# Patient Record
Sex: Male | Born: 1950 | Hispanic: No | Marital: Married | Smoking: Never smoker
Health system: Southern US, Community
[De-identification: ages and names within clinical notes are randomized; demographics above are authoritative.]

## PROBLEM LIST (undated history)

## (undated) DIAGNOSIS — H5359 Other color vision deficiencies: Secondary | ICD-10-CM

## (undated) DIAGNOSIS — Z87898 Personal history of other specified conditions: Secondary | ICD-10-CM

## (undated) DIAGNOSIS — Z0184 Encounter for antibody response examination: Secondary | ICD-10-CM

## (undated) DIAGNOSIS — B019 Varicella without complication: Secondary | ICD-10-CM

## (undated) DIAGNOSIS — C61 Malignant neoplasm of prostate: Secondary | ICD-10-CM

## (undated) DIAGNOSIS — Z789 Other specified health status: Secondary | ICD-10-CM

## (undated) HISTORY — DX: Other color vision deficiencies: H53.59

## (undated) HISTORY — DX: Varicella without complication: B01.9

## (undated) HISTORY — DX: Encounter for antibody response examination: Z01.84

## (undated) HISTORY — DX: Other specified health status: Z78.9

## (undated) HISTORY — DX: Personal history of other specified conditions: Z87.898

## (undated) HISTORY — PX: PROSTATE BIOPSY: SHX241

---

## 2008-03-28 HISTORY — PX: INGUINAL HERNIA REPAIR: SUR1180

## 2012-06-16 ENCOUNTER — Emergency Department (HOSPITAL_COMMUNITY)
Admission: EM | Admit: 2012-06-16 | Discharge: 2012-06-16 | Disposition: A | Discharge status: Home / Self Care | Payer: BC Managed Care – PPO | Attending: Emergency Medicine | Admitting: Emergency Medicine

## 2012-06-16 ENCOUNTER — Emergency Department (HOSPITAL_COMMUNITY): Payer: BC Managed Care – PPO

## 2012-06-16 ENCOUNTER — Encounter (HOSPITAL_COMMUNITY): Payer: Self-pay | Admitting: Emergency Medicine

## 2012-06-16 DIAGNOSIS — Y9239 Other specified sports and athletic area as the place of occurrence of the external cause: Secondary | ICD-10-CM | POA: Insufficient documentation

## 2012-06-16 DIAGNOSIS — W010XXA Fall on same level from slipping, tripping and stumbling without subsequent striking against object, initial encounter: Secondary | ICD-10-CM | POA: Insufficient documentation

## 2012-06-16 DIAGNOSIS — S0180XA Unspecified open wound of other part of head, initial encounter: Secondary | ICD-10-CM | POA: Insufficient documentation

## 2012-06-16 DIAGNOSIS — IMO0002 Reserved for concepts with insufficient information to code with codable children: Secondary | ICD-10-CM | POA: Insufficient documentation

## 2012-06-16 DIAGNOSIS — W1809XA Striking against other object with subsequent fall, initial encounter: Secondary | ICD-10-CM | POA: Insufficient documentation

## 2012-06-16 DIAGNOSIS — Y9373 Activity, racquet and hand sports: Secondary | ICD-10-CM | POA: Insufficient documentation

## 2012-06-16 DIAGNOSIS — R404 Transient alteration of awareness: Secondary | ICD-10-CM | POA: Insufficient documentation

## 2012-06-16 DIAGNOSIS — W19XXXA Unspecified fall, initial encounter: Secondary | ICD-10-CM

## 2012-06-16 DIAGNOSIS — S0181XA Laceration without foreign body of other part of head, initial encounter: Secondary | ICD-10-CM

## 2012-06-16 DIAGNOSIS — Z79899 Other long term (current) drug therapy: Secondary | ICD-10-CM | POA: Insufficient documentation

## 2012-06-16 NOTE — ED Notes (Signed)
Pt states he was playing tennis when he fell and hit his chin on the pavement.  Pt state he lost consciousness after that for 30 seconds, and lost consciousness briefly later as well.  Went to [facility], and was told to come here.  Pt alert, oriented, no bleeding noted from chin.

## 2012-06-16 NOTE — ED Provider Notes (Signed)
History     CSN: [id number]  Arrival date & time 06/16/12  1030   First MD Initiated Contact with Patient 06/16/12 1032      Chief Complaint  Patient presents with  . Loss of Consciousness  . Fall  . Head Laceration    (Consider location/radiation/quality/duration/timing/severity/associated sxs/prior treatment) HPI This is a 62 year old male who was playing tennis when he tripped and fell landing first on his hands and striking his chin. His friends are with him and he did not immediately have a loss of consciousness. They helped him over to a bench and put a bandage on his chin. They began playing tennis again and then noticed that he was slumped backwards against the fence. They laid him down and he woke up and they began to call 911 and he asked them to stop. They transported him by private vehicle here to the emergency department. He states he currently has no pain. He ambulated without difficulty. He does note the abrasions on his bilateral palms and a laceration below his chin. It had no difficulty with speech, vision, walking. He is not on any anticoagulation History reviewed. No pertinent past medical history.  History reviewed. No pertinent past surgical history.  History reviewed. No pertinent family history.  History  Substance Use Topics  . Smoking status: Never Smoker   . Smokeless tobacco: Not on file  . Alcohol Use: Yes      Review of Systems  All other systems reviewed and are negative.    Allergies  Review of patient's allergies indicates no known allergies.  Home Medications   Current Outpatient Rx  Name  Route  Sig  Dispense  Refill  . Multiple Vitamin (MULTIVITAMIN WITH MINERALS) TABS   Oral   Take 1 tablet by mouth daily.           BP 126/80  Pulse 56  Temp(Src) 97.4 F (36.3 C) (Oral)  Resp 16  SpO2 95%  Physical Exam  Nursing note and vitals reviewed. Constitutional: He is oriented to person, place, and time. He appears  well-developed and well-nourished.  HENT:  Head: Normocephalic.  Laceration under chin  Eyes: Conjunctivae and EOM are normal. Pupils are equal, round, and reactive to light.  Neck: Normal range of motion. Neck supple.  Cardiovascular: Normal rate, regular rhythm, normal heart sounds and intact distal pulses.   Pulmonary/Chest: Effort normal and breath sounds normal.  Abdominal: Soft.  Musculoskeletal: Normal range of motion.  No tenderness palpation of her bilateral hands or fingers or wrists  Neurological: He is alert and oriented to person, place, and time. He has normal strength and normal reflexes. Coordination and gait normal. GCS eye subscore is 4. GCS verbal subscore is 5. GCS motor subscore is 6.  Skin: Skin is warm.  Abrasion right hand abrasion left hand  Psychiatric: He has a normal mood and affect. His behavior is normal. Judgment and thought content normal.    ED Course  Procedures (including critical care time)  Labs Reviewed - No data to display No results found.   No diagnosis found.   Date: 06/16/2012  Rate: 53  Rhythm: normal sinus rhythm  QRS Axis: normal  Intervals: normal  ST/T Wave abnormalities: normal  Conduction Disutrbances:none  Narrative Interpretation:   Old EKG Reviewed: none available   LACERATION REPAIR Performed by: Hilario Quarry Authorized by: Hilario Quarry Consent: Verbal consent obtained. Risks and benefits: risks, benefits and alternatives were discussed Consent given by: patient Patient identity confirmed: provided  demographic data Prepped and Draped in normal sterile fashion Wound explored  Laceration Location: chin  Laceration Length: 2.5cm  No Foreign Bodies seen or palpated  Anesthesia: local infiltration  Local anesthetic: lidocaine 1% with epinephrine  Anesthetic total: 2 ml  Irrigation method: syringe Amount of cleaning: standard  Skin closure: 3 #5 prolene   Number of sutures: 3  Technique: simple  interrupted  Patient tolerance: Patient tolerated the procedure well with no immediate complications.  MDM  Patient to have CT scan performed 2 to age greater than 60 with head injury and loss of consciousness.    Patient continues awake and alert.  I have discussed with him indications for return and wound care.  He voices understanding.   Hilario Quarry, MD 06/17/12 [phone]

## 2013-07-10 ENCOUNTER — Ambulatory Visit: Payer: BC Managed Care – PPO | Admitting: Internal Medicine

## 2013-08-21 ENCOUNTER — Encounter: Payer: Self-pay | Admitting: Internal Medicine

## 2013-08-21 ENCOUNTER — Ambulatory Visit (INDEPENDENT_AMBULATORY_CARE_PROVIDER_SITE_OTHER): Payer: BC Managed Care – PPO | Admitting: Internal Medicine

## 2013-08-21 VITALS — BP 116/72 | Temp 97.4°F | Ht 65.0 in | Wt 144.0 lb

## 2013-08-21 DIAGNOSIS — L989 Disorder of the skin and subcutaneous tissue, unspecified: Secondary | ICD-10-CM

## 2013-08-21 DIAGNOSIS — Z7189 Other specified counseling: Secondary | ICD-10-CM

## 2013-08-21 NOTE — Patient Instructions (Addendum)
Get copy of immunizations  From work. For [place] and yourself. Will arrange a referral for  Dermatology to look at the skin area on your back. Continue lifestyle intervention healthy eating and exercise . 150 minutes of exercise weeks  , weight  athealthy levels. Avoid trans fats and processed foods;  Increase fresh fruits and veges to 5 servings per day. And avoid sweet beverages  Including tea and juice.  Please arrange for copies of your future evaluations and labs  At work to be sent to [place] for our records also . I will review and let you know if any other advice .    Preventive Care for Adults, Male A healthy lifestyle and preventive care can promote health and wellness. Preventive health guidelines for men include the following key practices:  A routine yearly physical is a good way to check with your health care provider about your health and preventative screening. It is a chance to share any concerns and updates on your health and to receive a thorough exam.  Visit your dentist for a routine exam and preventative care every 6 months. Brush your teeth twice a day and floss once a day. Good oral hygiene prevents tooth decay and gum disease.  The frequency of eye exams is based on your age, health, family medical history, use of contact lenses, and other factors. Follow your health care provider's recommendations for frequency of eye exams.  Eat a healthy diet. Foods such as vegetables, fruits, whole grains, low-fat dairy products, and lean protein foods contain the nutrients you need without too many calories. Decrease your intake of foods high in solid fats, added sugars, and salt. Eat the right amount of calories for you.Get information about a proper diet from your health care provider, if necessary.  Regular physical exercise is one of the most important things you can do for your health. Most adults should get at least 150 minutes of moderate-intensity exercise (any activity that  increases your heart rate and causes you to sweat) each week. In addition, most adults need muscle-strengthening exercises on 2 or more days a week.  Maintain a healthy weight. The body mass index (BMI) is a screening tool to identify possible weight problems. It provides an estimate of body fat based on height and weight. Your health care provider can find your BMI and can help you achieve or maintain a healthy weight.For adults 20 years and older:  A BMI below 18.5 is considered underweight.  A BMI of 18.5 to 24.9 is normal.  A BMI of 25 to 29.9 is considered overweight.  A BMI of 30 and above is considered obese.  Maintain normal blood lipids and cholesterol levels by exercising and minimizing your intake of saturated fat. Eat a balanced diet with plenty of fruit and vegetables. Blood tests for lipids and cholesterol should begin at age 76 and be repeated every 5 years. If your lipid or cholesterol levels are high, you are over 50, or you are at high risk for heart disease, you may need your cholesterol levels checked more frequently.Ongoing high lipid and cholesterol levels should be treated with medicines if diet and exercise are not working.  If you smoke, find out from your health care provider how to quit. If you do not use tobacco, do not start.  Lung cancer screening is recommended for adults aged 34 80 years who are at high risk for developing lung cancer because of a history of smoking. A yearly low-dose CT scan  of the lungs is recommended for people who have at least a 30-pack-year history of smoking and are a current smoker or have quit within the past 15 years. A pack year of smoking is smoking an average of 1 pack of cigarettes a day for 1 year (for example: 1 pack a day for 30 years or 2 packs a day for 15 years). Yearly screening should continue until the smoker has stopped smoking for at least 15 years. Yearly screening should be stopped for people who develop a health problem  that would prevent them from having lung cancer treatment.  If you choose to drink alcohol, do not have more than 2 drinks per day. One drink is considered to be 12 ounces (355 mL) of beer, 5 ounces (148 mL) of wine, or 1.5 ounces (44 mL) of liquor.  Avoid use of street drugs. Do not share needles with anyone. Ask for help if you need support or instructions about stopping the use of drugs.  High blood pressure causes heart disease and increases the risk of stroke. Your blood pressure should be checked at least every 1 2 years. Ongoing high blood pressure should be treated with medicines, if weight loss and exercise are not effective.  If you are 80 64 years old, ask your health care provider if you should take aspirin to prevent heart disease.  Diabetes screening involves taking a blood sample to check your fasting blood sugar level. This should be done once every 3 years, after age 48, if you are within normal weight and without risk factors for diabetes. Testing should be considered at a younger age or be carried out more frequently if you are overweight and have at least 1 risk factor for diabetes.  Colorectal cancer can be detected and often prevented. Most routine colorectal cancer screening begins at the age of 40 and continues through age 93. However, your health care provider may recommend screening at an earlier age if you have risk factors for colon cancer. On a yearly basis, your health care provider may provide home test kits to check for hidden blood in the stool. Use of a small camera at the end of a tube to directly examine the colon (sigmoidoscopy or colonoscopy) can detect the earliest forms of colorectal cancer. Talk to your health care provider about this at age 4, when routine screening begins. Direct exam of the colon should be repeated every 5 10 years through age 69, unless early forms of precancerous polyps or small growths are found.  People who are at an increased risk for  hepatitis B should be screened for this virus. You are considered at high risk for hepatitis B if:  You were born in a country where hepatitis B occurs often. Talk with your health care provider about which countries are considered high-risk.  Your parents were born in a high-risk country and you have not received a shot to protect against hepatitis B (hepatitis B vaccine).  You have HIV or AIDS.  You use needles to inject street drugs.  You live with, or have sex with, someone who has hepatitis B.  You are a man who has sex with other men (MSM).  You get hemodialysis treatment.  You take certain medicines for conditions such as cancer, organ transplantation, and autoimmune conditions.  Hepatitis C blood testing is recommended for all people born from [birth year] through 1965 and any individual with known risks for hepatitis C.  Practice safe sex. Use condoms and avoid  high-risk sexual practices to reduce the spread of sexually transmitted infections (STIs). STIs include gonorrhea, chlamydia, syphilis, trichomonas, herpes, HPV, and human immunodeficiency virus (HIV). Herpes, HIV, and HPV are viral illnesses that have no cure. They can result in disability, cancer, and death.  A one-time screening for abdominal aortic aneurysm (AAA) and surgical repair of large AAAs by ultrasound are recommended for men ages 74 to 32 years who are current or former smokers.  Healthy men should no longer receive prostate-specific antigen (PSA) blood tests as part of routine cancer screening. Talk with your health care provider about prostate cancer screening.  Testicular cancer screening is not recommended for adult males who have no symptoms. Screening includes self-exam, a health care provider exam, and other screening tests. Consult with your health care provider about any symptoms you have or any concerns you have about testicular cancer.  Use sunscreen. Apply sunscreen liberally and repeatedly throughout the  day. You should seek shade when your shadow is shorter than you. Protect yourself by wearing long sleeves, pants, a wide-brimmed hat, and sunglasses year round, whenever you are outdoors.  Once a month, do a whole-body skin exam, using a mirror to look at the skin on your back. Tell your health care provider about new moles, moles that have irregular borders, moles that are larger than a pencil eraser, or moles that have changed in shape or color.  Stay current with required vaccines (immunizations).  Influenza vaccine. All adults should be immunized every year.  Tetanus, diphtheria, and acellular pertussis (Td, Tdap) vaccine. An adult who has not previously received Tdap or who does not know his vaccine status should receive 1 dose of Tdap. This initial dose should be followed by tetanus and diphtheria toxoids (Td) booster doses every 10 years. Adults with an unknown or incomplete history of completing a 3-dose immunization series with Td-containing vaccines should begin or complete a primary immunization series including a Tdap dose. Adults should receive a Td booster every 10 years.  Varicella vaccine. An adult without evidence of immunity to varicella should receive 2 doses or a second dose if he has previously received 1 dose.  Human papillomavirus (HPV) vaccine. Males aged 42 21 years who have not received the vaccine previously should receive the 3-dose series. Males aged 30 26 years may be immunized. Immunization is recommended through the age of 37 years for any male who has sex with males and did not get any or all doses earlier. Immunization is recommended for any person with an immunocompromised condition through the age of 74 years if he did not get any or all doses earlier. During the 3-dose series, the second dose should be obtained 4 8 weeks after the first dose. The third dose should be obtained 24 weeks after the first dose and 16 weeks after the second dose.  Zoster vaccine. One dose  is recommended for adults aged 38 years or older unless certain conditions are present.  Measles, mumps, and rubella (MMR) vaccine. Adults born before [birth year] generally are considered immune to measles and mumps. Adults born in [birth year] or later should have 1 or more doses of MMR vaccine unless there is a contraindication to the vaccine or there is laboratory evidence of immunity to each of the three diseases. A routine second dose of MMR vaccine should be obtained at least 28 days after the first dose for students attending postsecondary schools, health care workers, or international travelers. People who received inactivated measles vaccine or an unknown type  of measles vaccine during 1963 1967 should receive 2 doses of MMR vaccine. People who received inactivated mumps vaccine or an unknown type of mumps vaccine before 1979 and are at high risk for mumps infection should consider immunization with 2 doses of MMR vaccine. Unvaccinated health care workers born before [birth year] who lack laboratory evidence of measles, mumps, or rubella immunity or laboratory confirmation of disease should consider measles and mumps immunization with 2 doses of MMR vaccine or rubella immunization with 1 dose of MMR vaccine.  Pneumococcal 13-valent conjugate (PCV13) vaccine. When indicated, a person who is uncertain of his immunization history and has no record of immunization should receive the PCV13 vaccine. An adult aged 66 years or older who has certain medical conditions and has not been previously immunized should receive 1 dose of PCV13 vaccine. This PCV13 should be followed with a dose of pneumococcal polysaccharide (PPSV23) vaccine. The PPSV23 vaccine dose should be obtained at least 8 weeks after the dose of PCV13 vaccine. An adult aged 83 years or older who has certain medical conditions and previously received 1 or more doses of PPSV23 vaccine should receive 1 dose of PCV13. The PCV13 vaccine dose should be obtained 1 or more  years after the last PPSV23 vaccine dose.  Pneumococcal polysaccharide (PPSV23) vaccine. When PCV13 is also indicated, PCV13 should be obtained first. All adults aged 71 years and older should be immunized. An adult younger than age 67 years who has certain medical conditions should be immunized. Any person who resides in a nursing home or long-term care facility should be immunized. An adult smoker should be immunized. People with an immunocompromised condition and certain other conditions should receive both PCV13 and PPSV23 vaccines. People with human immunodeficiency virus (HIV) infection should be immunized as soon as possible after diagnosis. Immunization during chemotherapy or radiation therapy should be avoided. Routine use of PPSV23 vaccine is not recommended for American Indians, [place] Natives, or people younger than 65 years unless there are medical conditions that require PPSV23 vaccine. When indicated, people who have unknown immunization and have no record of immunization should receive PPSV23 vaccine. One-time revaccination 5 years after the first dose of PPSV23 is recommended for people aged 81 64 years who have chronic kidney failure, nephrotic syndrome, asplenia, or immunocompromised conditions. People who received 1 2 doses of PPSV23 before age 28 years should receive another dose of PPSV23 vaccine at age 68 years or later if at least 5 years have passed since the previous dose. Doses of PPSV23 are not needed for people immunized with PPSV23 at or after age 24 years.  Meningococcal vaccine. Adults with asplenia or persistent complement component deficiencies should receive 2 doses of quadrivalent meningococcal conjugate (MenACWY-D) vaccine. The doses should be obtained at least 2 months apart. Microbiologists working with certain meningococcal bacteria, [place] recruits, people at risk during an outbreak, and people who travel to or live in countries with a high rate of meningitis should be  immunized. A first-year college student up through age 94 years who is living in a residence hall should receive a dose if he did not receive a dose on or after his 16th birthday. Adults who have certain high-risk conditions should receive one or more doses of vaccine.  Hepatitis A vaccine. Adults who wish to be protected from this disease, have certain high-risk conditions, work with hepatitis A-infected animals, work in hepatitis A research labs, or travel to or work in countries with a high rate of hepatitis A should  be immunized. Adults who were previously unvaccinated and who anticipate close contact with an international adoptee during the first 60 days after arrival in the [place] from a country with a high rate of hepatitis A should be immunized.  Hepatitis B vaccine. Adults who wish to be protected from this disease, have certain high-risk conditions, may be exposed to blood or other infectious body fluids, are household contacts or sex partners of hepatitis B positive people, are clients or workers in certain care facilities, or travel to or work in countries with a high rate of hepatitis B should be immunized.  Haemophilus influenzae type b (Hib) vaccine. A previously unvaccinated person with asplenia or sickle cell disease or having a scheduled splenectomy should receive 1 dose of Hib vaccine. Regardless of previous immunization, a recipient of a hematopoietic stem cell transplant should receive a 3-dose series 6 12 months after his successful transplant. Hib vaccine is not recommended for adults with HIV infection. Preventive Service / Frequency Ages 107 to 63  Blood pressure check.** / Every 1 to 2 years.  Lipid and cholesterol check.** / Every 5 years beginning at age 45.  Hepatitis C blood test.** / For any individual with known risks for hepatitis C.  Skin self-exam. / Monthly.  Influenza vaccine. / Every year.  Tetanus, diphtheria, and acellular pertussis (Tdap, Td)  vaccine.** / Consult your health care provider. 1 dose of Td every 10 years.  Varicella vaccine.** / Consult your health care provider.  HPV vaccine. / 3 doses over 6 months, if 64 or younger.  Measles, mumps, rubella (MMR) vaccine.** / You need at least 1 dose of MMR if you were born in 1957 or later. You may also need a second dose.  Pneumococcal 13-valent conjugate (PCV13) vaccine.** / Consult your health care provider.  Pneumococcal polysaccharide (PPSV23) vaccine.** / 1 to 2 doses if you smoke cigarettes or if you have certain conditions.  Meningococcal vaccine.** / 1 dose if you are age 41 to 41 years and a Market researcher living in a residence hall, or have one of several medical conditions. You may also need additional booster doses.  Hepatitis A vaccine.** / Consult your health care provider.  Hepatitis B vaccine.** / Consult your health care provider.  Haemophilus influenzae type b (Hib) vaccine.** / Consult your health care provider. Ages 33 to 71  Blood pressure check.** / Every 1 to 2 years.  Lipid and cholesterol check.** / Every 5 years beginning at age 15.  Lung cancer screening. / Every year if you are aged 4 80 years and have a 30-pack-year history of smoking and currently smoke or have quit within the past 15 years. Yearly screening is stopped once you have quit smoking for at least 15 years or develop a health problem that would prevent you from having lung cancer treatment.  Fecal occult blood test (FOBT) of stool. / Every year beginning at age 1 and continuing until age 54. You may not have to do this test if you get a colonoscopy every 10 years.  Flexible sigmoidoscopy** or colonoscopy.** / Every 5 years for a flexible sigmoidoscopy or every 10 years for a colonoscopy beginning at age 21 and continuing until age 34.  Hepatitis C blood test.** / For all people born from [birth year] through 1965 and any individual with known risks for hepatitis C.  Skin  self-exam. / Monthly.  Influenza vaccine. / Every year.  Tetanus, diphtheria, and acellular pertussis (Tdap/Td) vaccine.** / Consult your health  care provider. 1 dose of Td every 10 years.  Varicella vaccine.** / Consult your health care provider.  Zoster vaccine.** / 1 dose for adults aged 38 years or older.  Measles, mumps, rubella (MMR) vaccine.** / You need at least 1 dose of MMR if you were born in 1957 or later. You may also need a second dose.  Pneumococcal 13-valent conjugate (PCV13) vaccine.** / Consult your health care provider.  Pneumococcal polysaccharide (PPSV23) vaccine.** / 1 to 2 doses if you smoke cigarettes or if you have certain conditions.  Meningococcal vaccine.** / Consult your health care provider.  Hepatitis A vaccine.** / Consult your health care provider.  Hepatitis B vaccine.** / Consult your health care provider.  Haemophilus influenzae type b (Hib) vaccine.** / Consult your health care provider. Ages 5 and over  Blood pressure check.** / Every 1 to 2 years.  Lipid and cholesterol check.**/ Every 5 years beginning at age 65.  Lung cancer screening. / Every year if you are aged 63 80 years and have a 30-pack-year history of smoking and currently smoke or have quit within the past 15 years. Yearly screening is stopped once you have quit smoking for at least 15 years or develop a health problem that would prevent you from having lung cancer treatment.  Fecal occult blood test (FOBT) of stool. / Every year beginning at age 76 and continuing until age 1. You may not have to do this test if you get a colonoscopy every 10 years.  Flexible sigmoidoscopy** or colonoscopy.** / Every 5 years for a flexible sigmoidoscopy or every 10 years for a colonoscopy beginning at age 66 and continuing until age 34.  Hepatitis C blood test.** / For all people born from [birth year] through 1966 and any individual with known risks for hepatitis C.  Abdominal aortic aneurysm (AAA)  screening.** / A one-time screening for ages 12 to 43 years who are current or former smokers.  Skin self-exam. / Monthly.  Influenza vaccine. / Every year.  Tetanus, diphtheria, and acellular pertussis (Tdap/Td) vaccine.** / 1 dose of Td every 10 years.  Varicella vaccine.** / Consult your health care provider.  Zoster vaccine.** / 1 dose for adults aged 9 years or older.  Pneumococcal 13-valent conjugate (PCV13) vaccine.** / Consult your health care provider.  Pneumococcal polysaccharide (PPSV23) vaccine.** / 1 dose for all adults aged 23 years and older.  Meningococcal vaccine.** / Consult your health care provider.  Hepatitis A vaccine.** / Consult your health care provider.  Hepatitis B vaccine.** / Consult your health care provider.  Haemophilus influenzae type b (Hib) vaccine.** / Consult your health care provider. **Family history and personal history of risk and conditions may change your health care provider's recommendations. Document Released: 05/10/2001 Document Revised: 01/02/2013 Document Reviewed: 08/09/2010 [facility] Patient Information 2014 [place], [place].

## 2013-08-21 NOTE — Progress Notes (Signed)
Chief Complaint  Patient presents with  . Establish Care    Needed a PCP.    HPI: Patient comes in as new patient visit . Previous care was  3 since then, job-related care. 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 No recent injuries. Needs to establish care with her primary care physician. Does travel 3-4 times a year. No history of significant heart disease hypertension need for specialty care recently. He believes he had chickenpox. Has had a hepatitis B antibody that is +11 documented hepatitis B vaccine reviewing old records. He had lab works and the executive checkup last fall with no abnormalities.  Family healthy history grown children parents died in their late 68s to 84s. LIFESTYLE:  Exercise:  No limitations  Tobacco/ETS:no Alcohol: about 3-4 per week  Sugar beverages:no Sleep:7 hours  ? ambien for travel  Drug use: no   Health Maintenance  Topic Date Due  . Tetanus/tdap  12/28/1968  . Zostavax  12/28/2009  . Influenza Vaccine  10/26/2013  . Colonoscopy  03/28/2016   Health Maintenance Review   ROS:  GEN/ HEENT: No fever, significant weight changes sweats headaches vision problems hearing changes, CV/ PULM; No chest pain shortness of breath cough, syncope,edema  change in exercise tolerance. GI /GU: No adominal pain, vomiting, change in bowel habits. No blood in the stool. No significant GU symptoms. SKIN/HEME: ,no acute skin rashes suspicious lesions or bleeding. No lymphadenopathy, nodules, masses.  NEURO/ PSYCH:  No neurologic signs such as weakness numbness. No depression anxiety. IMM/ Allergy: No unusual infections.  Allergy .   REST of 12 system review negative except as per HPI   Past Medical History  Diagnosis Date  . Chicken pox     probable  . Partial color blindness   . H/O syncope      on tennis   neg eval [facility]   . Immunity to hepatitis B virus demonstrated by serologic test     hbsaby     Family History  Problem Relation Age of Onset  . Other Father     65  HEART   . Other Mother     36     History   Social History  . Marital Status: Married    Spouse Name: N/A    Number of Children: N/A  . Years of Education: N/A   Social History Main Topics  . Smoking status: Never Smoker   . Smokeless tobacco: Never Used  . Alcohol Use: Yes     Comment: Socially and over the weekend  . Drug Use: No  . Sexual Activity: None   Other Topics Concern  . None   Social History Narrative   7 hours of sleep per night   Lives with his wife   Has 3 grown daughters   At least 3 x per week.    No pets. xchildrens pets.    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 Multiple Vitamin (MULTIVITAMIN WITH MINERALS) TABS Take 1 tablet by mouth daily.  Marland Kitchen zolpidem (AMBIEN) 10 MG tablet  EXAM:  BP 116/72  Temp(Src) 97.4 F (36.3 C) (Oral)  Ht '5\' 5"'  (1.651 m)  Wt 144 lb (65.318 kg)  BMI 23.96 kg/m2  Body mass index is 23.96 kg/(m^2).  Physical Exam: Vital signs reviewed XWR:[id number] is a well-developed well-nourished alert cooperative  Male   who appearsr stated age in no acute distress.  HEENT: normocephalic atraumatic , Eyes: PERRL EOM's full, conjunctiva clear,  Glasses Nares: paten,t no deformity discharge or tenderness., Ears: no deformity EAC's clear TMs with normal landmarks. Mouth: clear OP, no lesions, edema.  Moist mucous membranes. Dentition in adequate repair. NECK: supple without masses, thyromegaly or bruits. CHEST/PULM:  Clear to auscultation and percussion breath sounds equal no wheeze , rales or rhonchi.  CV: PMI is nondisplaced, S1 S2 no gallops, murmurs, rubs. Peripheral pulses are full without delay.No JVD .   ABDOMEN: Bowel sounds normal nontender  No guard or rebound, no hepato splenomegal no CVA tenderness.  Extremtities:  No clubbing cyanosis or edema, no acute joint swelling NEURO:  Oriented x3, cranial nerves 3-12 appear to be intact, no obvious focal weakness,gait within normal limits SKIN:  Mid lower back midline there is a 8 mm flat nevus with speckled dark brown irreg  Otherwise  normal turgor, color, no bruising or petechiae. PSYCH: Oriented, good eye contact, no obvious depression anxiety, cognition and judgment appear normal. LN: no cervical l adenopathy Lab via  [facility] from 2014  Normal to be scanned   ASSESSMENT AND PLAN:  Discussed the following assessment and plan:  Skin lesion - ? if atypical mole ?BM cannot tell if new or not. [facility] derm to dhcheck will make referral. - Plan: Ambulatory referral to Dermatology  Encounter to establish care - appears otherwise healthy  counseled review and fu yealry or if needed  Patient Care Team: Burnis Medin, MD as PCP - General (Internal Medicine) Linton Rump, MD as Consulting Physician (Ophthalmology) Patient Instructions  Get copy of immunizations  From work. For [place] and yourself. Will arrange a referral for  Dermatology to look at the skin area on your back. Continue lifestyle intervention healthy eating and exercise . 150 minutes of exercise weeks  , weight  athealthy levels. Avoid trans fats and processed foods;  Increase fresh fruits and veges to 5 servings per day. And avoid sweet beverages  Including tea and juice.  Please arrange for copies of your future evaluations and labs  At work to be sent to [place] for our records also . I will review and let you know if any other advice .    Preventive Care for Adults, Male A healthy lifestyle and preventive care can promote health and wellness. Preventive health guidelines for men include the following key practices:  A routine yearly physical is a good way to check with  your health care provider about your health and preventative screening. It is a chance to share any concerns and updates on your health and to receive a thorough exam.  Visit your dentist for a routine exam and preventative care every 6 months. Brush your teeth twice a day and floss once a day. Good oral hygiene prevents tooth decay and gum disease.  The frequency of eye exams is based on your age, health, family medical history, use of contact lenses, and other factors. Follow your health care provider's recommendations for frequency of eye exams.  Eat a healthy diet. Foods such as vegetables, fruits, whole grains, low-fat dairy products, and lean protein foods contain  the nutrients you need without too many calories. Decrease your intake of foods high in solid fats, added sugars, and salt. Eat the right amount of calories for you.Get information about a proper diet from your health care provider, if necessary.  Regular physical exercise is one of the most important things you can do for your health. Most adults should get at least 150 minutes of moderate-intensity exercise (any activity that increases your heart rate and causes you to sweat) each week. In addition, most adults need muscle-strengthening exercises on 2 or more days a week.  Maintain a healthy weight. The body mass index (BMI) is a screening tool to identify possible weight problems. It provides an estimate of body fat based on height and weight. Your health care provider can find your BMI and can help you achieve or maintain a healthy weight.For adults 20 years and older:  A BMI below 18.5 is considered underweight.  A BMI of 18.5 to 24.9 is normal.  A BMI of 25 to 29.9 is considered overweight.  A BMI of 30 and above is considered obese.  Maintain normal blood lipids and cholesterol levels by exercising and minimizing your intake of saturated fat. Eat a balanced diet with plenty of fruit and vegetables. Blood tests for lipids  and cholesterol should begin at age 16 and be repeated every 5 years. If your lipid or cholesterol levels are high, you are over 50, or you are at high risk for heart disease, you may need your cholesterol levels checked more frequently.Ongoing high lipid and cholesterol levels should be treated with medicines if diet and exercise are not working.  If you smoke, find out from your health care provider how to quit. If you do not use tobacco, do not start.  Lung cancer screening is recommended for adults aged 3 80 years who are at high risk for developing lung cancer because of a history of smoking. A yearly low-dose CT scan of the lungs is recommended for people who have at least a 30-pack-year history of smoking and are a current smoker or have quit within the past 15 years. A pack year of smoking is smoking an average of 1 pack of cigarettes a day for 1 year (for example: 1 pack a day for 30 years or 2 packs a day for 15 years). Yearly screening should continue until the smoker has stopped smoking for at least 15 years. Yearly screening should be stopped for people who develop a health problem that would prevent them from having lung cancer treatment.  If you choose to drink alcohol, do not have more than 2 drinks per day. One drink is considered to be 12 ounces (355 mL) of beer, 5 ounces (148 mL) of wine, or 1.5 ounces (44 mL) of liquor.  Avoid use of street drugs. Do not share needles with anyone. Ask for help if you need support or instructions about stopping the use of drugs.  High blood pressure causes heart disease and increases the risk of stroke. Your blood pressure should be checked at least every 1 2 years. Ongoing high blood pressure should be treated with medicines, if weight loss and exercise are not effective.  If you are 75 64 years old, ask your health care provider if you should take aspirin to prevent heart disease.  Diabetes screening involves taking a blood sample to check your  fasting blood sugar level. This should be done once every 3 years, after age 50, if you are within normal  weight and without risk factors for diabetes. Testing should be considered at a younger age or be carried out more frequently if you are overweight and have at least 1 risk factor for diabetes.  Colorectal cancer can be detected and often prevented. Most routine colorectal cancer screening begins at the age of 70 and continues through age 83. However, your health care provider may recommend screening at an earlier age if you have risk factors for colon cancer. On a yearly basis, your health care provider may provide home test kits to check for hidden blood in the stool. Use of a small camera at the end of a tube to directly examine the colon (sigmoidoscopy or colonoscopy) can detect the earliest forms of colorectal cancer. Talk to your health care provider about this at age 63, when routine screening begins. Direct exam of the colon should be repeated every 5 10 years through age 83, unless early forms of precancerous polyps or small growths are found.  People who are at an increased risk for hepatitis B should be screened for this virus. You are considered at high risk for hepatitis B if:  You were born in a country where hepatitis B occurs often. Talk with your health care provider about which countries are considered high-risk.  Your parents were born in a high-risk country and you have not received a shot to protect against hepatitis B (hepatitis B vaccine).  You have HIV or AIDS.  You use needles to inject street drugs.  You live with, or have sex with, someone who has hepatitis B.  You are a man who has sex with other men (MSM).  You get hemodialysis treatment.  You take certain medicines for conditions such as cancer, organ transplantation, and autoimmune conditions.  Hepatitis C blood testing is recommended for all people born from [birth year] through 1965 and any individual with known  risks for hepatitis C.  Practice safe sex. Use condoms and avoid high-risk sexual practices to reduce the spread of sexually transmitted infections (STIs). STIs include gonorrhea, chlamydia, syphilis, trichomonas, herpes, HPV, and human immunodeficiency virus (HIV). Herpes, HIV, and HPV are viral illnesses that have no cure. They can result in disability, cancer, and death.  A one-time screening for abdominal aortic aneurysm (AAA) and surgical repair of large AAAs by ultrasound are recommended for men ages 25 to 42 years who are current or former smokers.  Healthy men should no longer receive prostate-specific antigen (PSA) blood tests as part of routine cancer screening. Talk with your health care provider about prostate cancer screening.  Testicular cancer screening is not recommended for adult males who have no symptoms. Screening includes self-exam, a health care provider exam, and other screening tests. Consult with your health care provider about any symptoms you have or any concerns you have about testicular cancer.  Use sunscreen. Apply sunscreen liberally and repeatedly throughout the day. You should seek shade when your shadow is shorter than you. Protect yourself by wearing long sleeves, pants, a wide-brimmed hat, and sunglasses year round, whenever you are outdoors.  Once a month, do a whole-body skin exam, using a mirror to look at the skin on your back. Tell your health care provider about new moles, moles that have irregular borders, moles that are larger than a pencil eraser, or moles that have changed in shape or color.  Stay current with required vaccines (immunizations).  Influenza vaccine. All adults should be immunized every year.  Tetanus, diphtheria, and acellular pertussis (Td, Tdap) vaccine.  An adult who has not previously received Tdap or who does not know his vaccine status should receive 1 dose of Tdap. This initial dose should be followed by tetanus and diphtheria  toxoids (Td) booster doses every 10 years. Adults with an unknown or incomplete history of completing a 3-dose immunization series with Td-containing vaccines should begin or complete a primary immunization series including a Tdap dose. Adults should receive a Td booster every 10 years.  Varicella vaccine. An adult without evidence of immunity to varicella should receive 2 doses or a second dose if he has previously received 1 dose.  Human papillomavirus (HPV) vaccine. Males aged 73 21 years who have not received the vaccine previously should receive the 3-dose series. Males aged 51 26 years may be immunized. Immunization is recommended through the age of 55 years for any male who has sex with males and did not get any or all doses earlier. Immunization is recommended for any person with an immunocompromised condition through the age of 18 years if he did not get any or all doses earlier. During the 3-dose series, the second dose should be obtained 4 8 weeks after the first dose. The third dose should be obtained 24 weeks after the first dose and 16 weeks after the second dose.  Zoster vaccine. One dose is recommended for adults aged 75 years or older unless certain conditions are present.  Measles, mumps, and rubella (MMR) vaccine. Adults born before [birth year] generally are considered immune to measles and mumps. Adults born in [birth year] or later should have 1 or more doses of MMR vaccine unless there is a contraindication to the vaccine or there is laboratory evidence of immunity to each of the three diseases. A routine second dose of MMR vaccine should be obtained at least 28 days after the first dose for students attending postsecondary schools, health care workers, or international travelers. People who received inactivated measles vaccine or an unknown type of measles vaccine during 1963 1967 should receive 2 doses of MMR vaccine. People who received inactivated mumps vaccine or an unknown type of mumps vaccine  before 1979 and are at high risk for mumps infection should consider immunization with 2 doses of MMR vaccine. Unvaccinated health care workers born before [birth year] who lack laboratory evidence of measles, mumps, or rubella immunity or laboratory confirmation of disease should consider measles and mumps immunization with 2 doses of MMR vaccine or rubella immunization with 1 dose of MMR vaccine.  Pneumococcal 13-valent conjugate (PCV13) vaccine. When indicated, a person who is uncertain of his immunization history and has no record of immunization should receive the PCV13 vaccine. An adult aged 41 years or older who has certain medical conditions and has not been previously immunized should receive 1 dose of PCV13 vaccine. This PCV13 should be followed with a dose of pneumococcal polysaccharide (PPSV23) vaccine. The PPSV23 vaccine dose should be obtained at least 8 weeks after the dose of PCV13 vaccine. An adult aged 46 years or older who has certain medical conditions and previously received 1 or more doses of PPSV23 vaccine should receive 1 dose of PCV13. The PCV13 vaccine dose should be obtained 1 or more years after the last PPSV23 vaccine dose.  Pneumococcal polysaccharide (PPSV23) vaccine. When PCV13 is also indicated, PCV13 should be obtained first. All adults aged 44 years and older should be immunized. An adult younger than age 45 years who has certain medical conditions should be immunized. Any person who resides in a nursing home or  long-term care facility should be immunized. An adult smoker should be immunized. People with an immunocompromised condition and certain other conditions should receive both PCV13 and PPSV23 vaccines. People with human immunodeficiency virus (HIV) infection should be immunized as soon as possible after diagnosis. Immunization during chemotherapy or radiation therapy should be avoided. Routine use of PPSV23 vaccine is not recommended for American Indians, [place] Natives, or  people younger than 65 years unless there are medical conditions that require PPSV23 vaccine. When indicated, people who have unknown immunization and have no record of immunization should receive PPSV23 vaccine. One-time revaccination 5 years after the first dose of PPSV23 is recommended for people aged 17 64 years who have chronic kidney failure, nephrotic syndrome, asplenia, or immunocompromised conditions. People who received 1 2 doses of PPSV23 before age 78 years should receive another dose of PPSV23 vaccine at age 58 years or later if at least 5 years have passed since the previous dose. Doses of PPSV23 are not needed for people immunized with PPSV23 at or after age 69 years.  Meningococcal vaccine. Adults with asplenia or persistent complement component deficiencies should receive 2 doses of quadrivalent meningococcal conjugate (MenACWY-D) vaccine. The doses should be obtained at least 2 months apart. Microbiologists working with certain meningococcal bacteria, [employer] recruits, people at risk during an outbreak, and people who travel to or live in countries with a high rate of meningitis should be immunized. A first-year college student up through age 56 years who is living in a residence hall should receive a dose if he did not receive a dose on or after his 16th birthday. Adults who have certain high-risk conditions should receive one or more doses of vaccine.  Hepatitis A vaccine. Adults who wish to be protected from this disease, have certain high-risk conditions, work with hepatitis A-infected animals, work in hepatitis A research labs, or travel to or work in countries with a high rate of hepatitis A should be immunized. Adults who were previously unvaccinated and who anticipate close contact with an international adoptee during the first 60 days after arrival in the [place] from a country with a high rate of hepatitis A should be immunized.  Hepatitis B vaccine. Adults who wish to be  protected from this disease, have certain high-risk conditions, may be exposed to blood or other infectious body fluids, are household contacts or sex partners of hepatitis B positive people, are clients or workers in certain care facilities, or travel to or work in countries with a high rate of hepatitis B should be immunized.  Haemophilus influenzae type b (Hib) vaccine. A previously unvaccinated person with asplenia or sickle cell disease or having a scheduled splenectomy should receive 1 dose of Hib vaccine. Regardless of previous immunization, a recipient of a hematopoietic stem cell transplant should receive a 3-dose series 6 12 months after his successful transplant. Hib vaccine is not recommended for adults with HIV infection. Preventive Service / Frequency Ages 2 to 28  Blood pressure check.** / Every 1 to 2 years.  Lipid and cholesterol check.** / Every 5 years beginning at age 67.  Hepatitis C blood test.** / For any individual with known risks for hepatitis C.  Skin self-exam. / Monthly.  Influenza vaccine. / Every year.  Tetanus, diphtheria, and acellular pertussis (Tdap, Td) vaccine.** / Consult your health care provider. 1 dose of Td every 10 years.  Varicella vaccine.** / Consult your health care provider.  HPV vaccine. / 3 doses over 6 months, if 26  or younger.  Measles, mumps, rubella (MMR) vaccine.** / You need at least 1 dose of MMR if you were born in 1957 or later. You may also need a second dose.  Pneumococcal 13-valent conjugate (PCV13) vaccine.** / Consult your health care provider.  Pneumococcal polysaccharide (PPSV23) vaccine.** / 1 to 2 doses if you smoke cigarettes or if you have certain conditions.  Meningococcal vaccine.** / 1 dose if you are age 43 to 68 years and a Market researcher living in a residence hall, or have one of several medical conditions. You may also need additional booster doses.  Hepatitis A vaccine.** / Consult your health  care provider.  Hepatitis B vaccine.** / Consult your health care provider.  Haemophilus influenzae type b (Hib) vaccine.** / Consult your health care provider. Ages 60 to 36  Blood pressure check.** / Every 1 to 2 years.  Lipid and cholesterol check.** / Every 5 years beginning at age 29.  Lung cancer screening. / Every year if you are aged 29 80 years and have a 30-pack-year history of smoking and currently smoke or have quit within the past 15 years. Yearly screening is stopped once you have quit smoking for at least 15 years or develop a health problem that would prevent you from having lung cancer treatment.  Fecal occult blood test (FOBT) of stool. / Every year beginning at age 39 and continuing until age 85. You may not have to do this test if you get a colonoscopy every 10 years.  Flexible sigmoidoscopy** or colonoscopy.** / Every 5 years for a flexible sigmoidoscopy or every 10 years for a colonoscopy beginning at age 40 and continuing until age 1.  Hepatitis C blood test.** / For all people born from [birth year] through 1965 and any individual with known risks for hepatitis C.  Skin self-exam. / Monthly.  Influenza vaccine. / Every year.  Tetanus, diphtheria, and acellular pertussis (Tdap/Td) vaccine.** / Consult your health care provider. 1 dose of Td every 10 years.  Varicella vaccine.** / Consult your health care provider.  Zoster vaccine.** / 1 dose for adults aged 49 years or older.  Measles, mumps, rubella (MMR) vaccine.** / You need at least 1 dose of MMR if you were born in 1957 or later. You may also need a second dose.  Pneumococcal 13-valent conjugate (PCV13) vaccine.** / Consult your health care provider.  Pneumococcal polysaccharide (PPSV23) vaccine.** / 1 to 2 doses if you smoke cigarettes or if you have certain conditions.  Meningococcal vaccine.** / Consult your health care provider.  Hepatitis A vaccine.** / Consult your health care provider.  Hepatitis B  vaccine.** / Consult your health care provider.  Haemophilus influenzae type b (Hib) vaccine.** / Consult your health care provider. Ages 10 and over  Blood pressure check.** / Every 1 to 2 years.  Lipid and cholesterol check.**/ Every 5 years beginning at age 53.  Lung cancer screening. / Every year if you are aged 56 80 years and have a 30-pack-year history of smoking and currently smoke or have quit within the past 15 years. Yearly screening is stopped once you have quit smoking for at least 15 years or develop a health problem that would prevent you from having lung cancer treatment.  Fecal occult blood test (FOBT) of stool. / Every year beginning at age 50 and continuing until age 57. You may not have to do this test if you get a colonoscopy every 10 years.  Flexible sigmoidoscopy** or colonoscopy.** / Every 5  years for a flexible sigmoidoscopy or every 10 years for a colonoscopy beginning at age 34 and continuing until age 49.  Hepatitis C blood test.** / For all people born from [birth year] through 1966 and any individual with known risks for hepatitis C.  Abdominal aortic aneurysm (AAA) screening.** / A one-time screening for ages 105 to 27 years who are current or former smokers.  Skin self-exam. / Monthly.  Influenza vaccine. / Every year.  Tetanus, diphtheria, and acellular pertussis (Tdap/Td) vaccine.** / 1 dose of Td every 10 years.  Varicella vaccine.** / Consult your health care provider.  Zoster vaccine.** / 1 dose for adults aged 55 years or older.  Pneumococcal 13-valent conjugate (PCV13) vaccine.** / Consult your health care provider.  Pneumococcal polysaccharide (PPSV23) vaccine.** / 1 dose for all adults aged 26 years and older.  Meningococcal vaccine.** / Consult your health care provider.  Hepatitis A vaccine.** / Consult your health care provider.  Hepatitis B vaccine.** / Consult your health care provider.  Haemophilus influenzae type b (Hib) vaccine.** /  Consult your health care provider. **Family history and personal history of risk and conditions may change your health care provider's recommendations. Document Released: 05/10/2001 Document Revised: 01/02/2013 Document Reviewed: 08/09/2010 [facility] Patient Information 2014 [place], [place].     Standley Brooking. Emmagrace Runkel M.D.   Pre visit review using our clinic review tool, if applicable. No additional management support is needed unless otherwise documented below in the visit note.

## 2015-12-31 ENCOUNTER — Encounter: Payer: Self-pay | Admitting: Gastroenterology

## 2016-02-17 ENCOUNTER — Encounter: Payer: Self-pay | Admitting: Gastroenterology

## 2016-03-24 ENCOUNTER — Ambulatory Visit: Payer: BLUE CROSS/BLUE SHIELD | Admitting: *Deleted

## 2016-03-24 VITALS — Ht 65.5 in | Wt 145.8 lb

## 2016-03-24 DIAGNOSIS — Z1211 Encounter for screening for malignant neoplasm of colon: Secondary | ICD-10-CM

## 2016-03-24 MED ORDER — NA SULFATE-K SULFATE-MG SULF 17.5-3.13-1.6 GM/177ML PO SOLN: 1.0000 | Freq: Once | ORAL | 0 refills | Status: AC

## 2016-03-24 NOTE — Progress Notes (Signed)
Denies allergies to eggs or soy products. Denies complications with sedation or anesthesia. Denies O2 use. Denies use of diet or weight loss medications.  Emmi instructions given for colonoscopy.  

## 2016-04-07 ENCOUNTER — Encounter: Payer: Self-pay | Admitting: Gastroenterology

## 2016-04-07 ENCOUNTER — Ambulatory Visit (AMBULATORY_SURGERY_CENTER): Payer: BLUE CROSS/BLUE SHIELD | Admitting: Gastroenterology

## 2016-04-07 VITALS — BP 100/64 | HR 48 | Temp 96.6°F | Resp 19 | Ht 65.0 in | Wt 145.0 lb

## 2016-04-07 DIAGNOSIS — Z1212 Encounter for screening for malignant neoplasm of rectum: Secondary | ICD-10-CM | POA: Diagnosis not present

## 2016-04-07 DIAGNOSIS — Z1211 Encounter for screening for malignant neoplasm of colon: Secondary | ICD-10-CM

## 2016-04-07 MED ORDER — SODIUM CHLORIDE 0.9 % IV SOLN: 500.0000 mL | INTRAVENOUS | Status: DC

## 2016-04-07 NOTE — Op Note (Signed)
[place] Patient Name: Shane Adkins Procedure Date: 04/07/2016 9:01 AM MRN: [id number] Endoscopist: Mauri Pole , MD Age: 66 Referring MD:  Date of Birth: 1950/12/19 Gender: Male Account #: [id number] Procedure:                Colonoscopy Indications:              Screening for colorectal malignant neoplasm, Last                            colonoscopy: 2005 Medicines:                Monitored Anesthesia Care Procedure:                Pre-Anesthesia Assessment:                           - Prior to the procedure, a History and Physical                            was performed, and patient medications and                            allergies were reviewed. The patient's tolerance of                            previous anesthesia was also reviewed. The risks                            and benefits of the procedure and the sedation                            options and risks were discussed with the patient.                            All questions were answered, and informed consent                            was obtained. Prior Anticoagulants: The patient has                            taken no previous anticoagulant or antiplatelet                            agents. ASA Grade Assessment: II - A patient with                            mild systemic disease. After reviewing the risks                            and benefits, the patient was deemed in                            satisfactory condition to undergo the procedure.  After obtaining informed consent, the colonoscope                            was passed under direct vision. Throughout the                            procedure, the patient's blood pressure, pulse, and                            oxygen saturations were monitored continuously. The                            Model CF-HQ190L [id number]) scope was introduced                            through the anus and advanced to the the  terminal                            ileum, with identification of the appendiceal                            orifice and IC valve. The colonoscopy was performed                            without difficulty. The patient tolerated the                            procedure well. The quality of the bowel                            preparation was excellent. The terminal ileum,                            ileocecal valve, appendiceal orifice, and rectum                            were photographed. Scope In: 9:05:24 AM Scope Out: 9:21:39 AM Scope Withdrawal Time: 0 hours 12 minutes 48 seconds  Total Procedure Duration: 0 hours 16 minutes 15 seconds  Findings:                 The perianal and digital rectal examinations were                            normal.                           Multiple small and large-mouthed diverticula were                            found in the sigmoid colon. There was no evidence                            of diverticular bleeding.  Non-bleeding internal hemorrhoids were found during                            retroflexion. The hemorrhoids were small.                           The exam was otherwise without abnormality. Complications:            No immediate complications. Estimated Blood Loss:     Estimated blood loss: none. Impression:               - Mild diverticulosis in the sigmoid colon. There                            was no evidence of diverticular bleeding.                           - Non-bleeding internal hemorrhoids.                           - The examination was otherwise normal.                           - No specimens collected. Recommendation:           - Patient has a contact number available for                            emergencies. The signs and symptoms of potential                            delayed complications were discussed with the                            patient. Return to normal activities tomorrow.                             Written discharge instructions were provided to the                            patient.                           - Resume previous diet.                           - Continue present medications.                           - Repeat colonoscopy in 10 years for screening                            purposes.                           - Return to GI clinic PRN. Mauri Pole, MD 04/07/2016 9:28:17 AM This report has been signed electronically.

## 2016-04-07 NOTE — Progress Notes (Signed)
Report to PACU, RN, vss, BBS= Clear.  

## 2016-04-07 NOTE — Patient Instructions (Signed)
YOU HAD AN ENDOSCOPIC PROCEDURE TODAY AT [facility]:   Refer to the procedure report that was given to you for any specific questions about what was found during the examination.  If the procedure report does not answer your questions, please call your gastroenterologist to clarify.  If you requested that your care partner not be given the details of your procedure findings, then the procedure report has been included in a sealed envelope for you to review at your convenience later.  YOU SHOULD EXPECT: Some feelings of bloating in the abdomen. Passage of more gas than usual.  Walking can help get rid of the air that was put into your GI tract during the procedure and reduce the bloating. If you had a lower endoscopy (such as a colonoscopy or flexible sigmoidoscopy) you may notice spotting of blood in your stool or on the toilet paper. If you underwent a bowel prep for your procedure, you may not have a normal bowel movement for a few days.  Please Note:  You might notice some irritation and congestion in your nose or some drainage.  This is from the oxygen used during your procedure.  There is no need for concern and it should clear up in a day or so.  SYMPTOMS TO REPORT IMMEDIATELY:   Following lower endoscopy (colonoscopy or flexible sigmoidoscopy):  Excessive amounts of blood in the stool  Significant tenderness or worsening of abdominal pains  Swelling of the abdomen that is new, acute  Fever of 100F or higher   For urgent or emergent issues, a gastroenterologist can be reached at any hour by calling [phone].   DIET:  We do recommend a small meal at first, but then you may proceed to your regular diet.  Drink plenty of fluids but you should avoid alcoholic beverages for 24 hours.  ACTIVITY:  You should plan to take it easy for the rest of today and you should NOT DRIVE or use heavy machinery until tomorrow (because of the sedation medicines used during the test).     FOLLOW UP: Our staff will call the number listed on your records the next business day following your procedure to check on you and address any questions or concerns that you may have regarding the information given to you following your procedure. If we do not reach you, we will leave a message.  However, if you are feeling well and you are not experiencing any problems, there is no need to return our call.  We will assume that you have returned to your regular daily activities without incident.  Repeat Colonoscopy in 10 years Diverticulosis (handout given) Hemorrhoids (handout given)  If any biopsies were taken you will be contacted by phone or by letter within the next 1-3 weeks.  Please call us at [phone] if you have not heard about the biopsies in 3 weeks.    SIGNATURES/CONFIDENTIALITY: You and/or your care partner have signed paperwork which will be entered into your electronic medical record.  These signatures attest to the fact that that the information above on your After Visit Summary has been reviewed and is understood.  Full responsibility of the confidentiality of this discharge information lies with you and/or your care-partner.

## 2016-04-08 ENCOUNTER — Telehealth: Payer: Self-pay

## 2016-04-08 NOTE — Telephone Encounter (Signed)
  Follow up Call-  Call back number 04/07/2016  Post procedure Call Back phone  # [phone]  Permission to leave phone message Yes  Some recent data might be hidden     Patient questions:  Do you have a fever, pain , or abdominal swelling? No. Pain Score  0 *  Have you tolerated food without any problems? Yes.    Have you been able to return to your normal activities? Yes.    Do you have any questions about your discharge instructions: Diet   No. Medications  No. Follow up visit  No.  Do you have questions or concerns about your Care? No.  Actions: * If pain score is 4 or above: No action needed, pain <4.

## 2018-05-03 ENCOUNTER — Other Ambulatory Visit: Payer: Self-pay | Admitting: Urology

## 2018-05-03 DIAGNOSIS — C61 Malignant neoplasm of prostate: Secondary | ICD-10-CM

## 2018-06-18 ENCOUNTER — Ambulatory Visit
Admission: RE | Admit: 2018-06-18 | Discharge: 2018-06-18 | Disposition: A | Discharge status: Home / Self Care | Payer: BLUE CROSS/BLUE SHIELD | Source: Ambulatory Visit | Attending: Urology | Admitting: Urology

## 2018-06-18 ENCOUNTER — Other Ambulatory Visit: Payer: Self-pay

## 2018-06-18 DIAGNOSIS — C61 Malignant neoplasm of prostate: Secondary | ICD-10-CM

## 2018-06-18 MED ORDER — GADOBENATE DIMEGLUMINE 529 MG/ML IV SOLN
13.0000 mL | Freq: Once | INTRAVENOUS | Status: AC | PRN
  Administered 2018-06-18: 13 mL via INTRAVENOUS

## 2019-12-20 ENCOUNTER — Other Ambulatory Visit: Payer: Self-pay | Admitting: Family Medicine

## 2020-05-04 ENCOUNTER — Other Ambulatory Visit: Payer: Self-pay | Admitting: Urology

## 2020-05-04 DIAGNOSIS — C61 Malignant neoplasm of prostate: Secondary | ICD-10-CM

## 2020-05-23 ENCOUNTER — Ambulatory Visit
Admission: RE | Admit: 2020-05-23 | Discharge: 2020-05-23 | Disposition: A | Discharge status: Home / Self Care | Payer: Self-pay | Source: Ambulatory Visit | Attending: Urology | Admitting: Urology

## 2020-05-23 ENCOUNTER — Other Ambulatory Visit: Payer: Self-pay

## 2020-05-23 DIAGNOSIS — C61 Malignant neoplasm of prostate: Secondary | ICD-10-CM

## 2020-05-23 IMAGING — MR MR PROSTATE WO/W CM
12 series · 48 of 48 positions shown · IV contrast (multihance)
Comparison: [DATE]

CLINICAL DATA: Prostate cancer, Gleason score 7 in the left mid
gland, PSA

EXAM:
MR PROSTATE WITHOUT AND WITH CONTRAST
TECHNIQUE: Multiplanar multisequence MRI images were obtained of the pelvis
centered about the prostate. Pre and post contrast images were
obtained.
CONTRAST:  13mL MULTIHANCE GADOBENATE DIMEGLUMINE 529 MG/ML IV SOLN

[Series 3: T2 · coronal · 3.0mm · 0.70mm/px · 1 of 20 slices shown (1 of 3)]
[im 1/20]
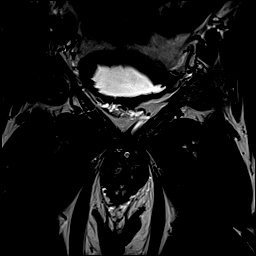

[Series 4: T1 · axial · 5.0mm · 1.25mm/px · 1 of 80 slices shown]
[im 1/80]
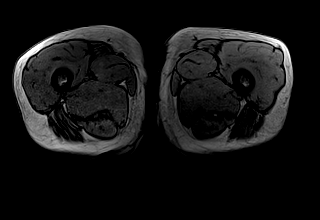

[Series 5: DWI · axial · 3.0mm · 1.75mm/px · z∈[-71,-14]mm · 2 of 60 slices shown (1 of 3)]
[im 1/60]
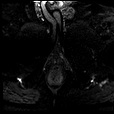
[im 60/60]
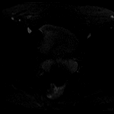

[Series 6: DWI · axial · 3.0mm · 1.75mm/px · 1 of 20 slices shown (2 of 3)]
[im 1/20]
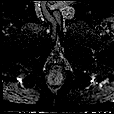

[Series 7: DWI · axial · 3.0mm · 1.75mm/px · 1 of 20 slices shown (3 of 3)]
[im 1/20]
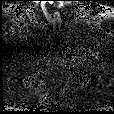

[Series 8: T2 · axial · 3.0mm · 0.70mm/px · 1 of 21 slices shown (2 of 3)]
[im 1/21]
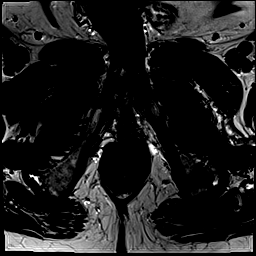

[Series 9: T2 · axial · 1.0mm · 1.04mm/px · z∈[-74,+5]mm · 2 of 80 slices shown (3 of 3)]
[im 1/80]
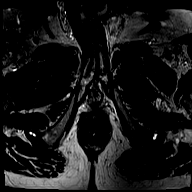
[im 80/80]
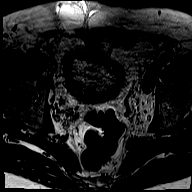

[Series 10: pre t1_twist_tra_dyn · axial · non-contrast · 3.5mm · 0.83mm/px · 1 of 20 slices shown]
[im 1/20]
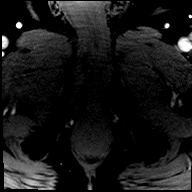

[Series 11: post t1_twist_tra_dyn-copy center · axial · non-contrast · 3.5mm · 0.83mm/px · z∈[-74,-7]mm · 17 of 600 slices shown]
[im 1/600]
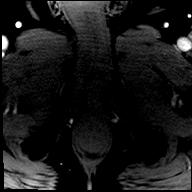
[im 38/600]
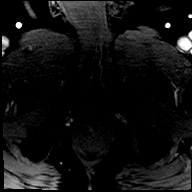
[im 75/600]
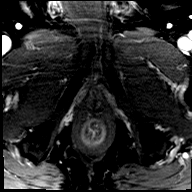
[im 113/600]
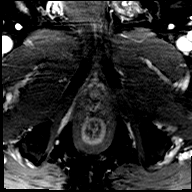
[im 150/600]
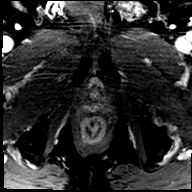
[im 188/600]
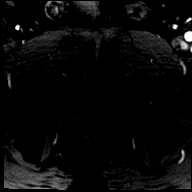
[im 225/600]
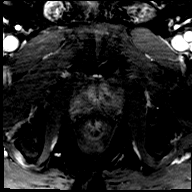
[im 263/600]
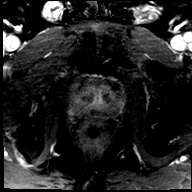
[im 300/600]
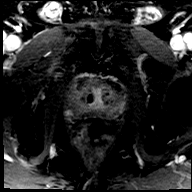
[im 337/600]
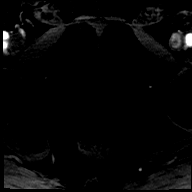
[im 375/600]
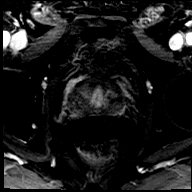
[im 412/600]
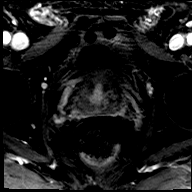
[im 450/600]
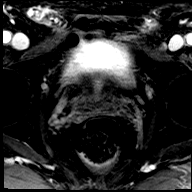
[im 487/600]
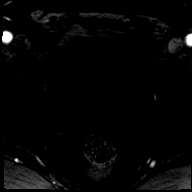
[im 525/600]
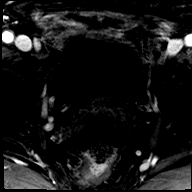
[im 562/600]
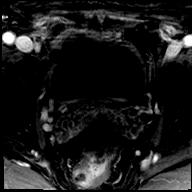
[im 600/600]
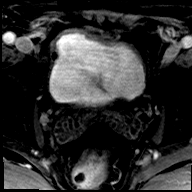

[Series 12: post t1_twist_tra_dyn-copy cent_sub · axial · 3.5mm · 0.83mm/px · z∈[-74,-7]mm · 17 of 580 slices shown]
[im 1/580]
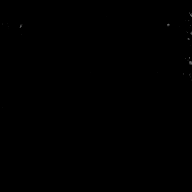
[im 37/580]
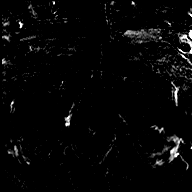
[im 73/580]
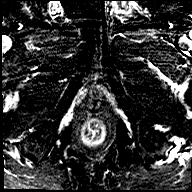
[im 109/580]
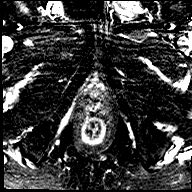
[im 145/580]
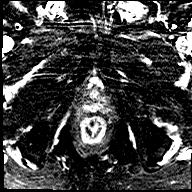
[im 181/580]
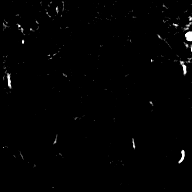
[im 218/580]
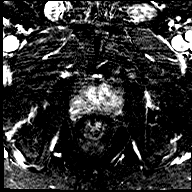
[im 254/580]
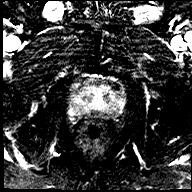
[im 290/580]
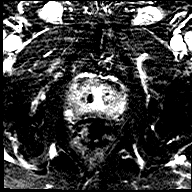
[im 326/580]
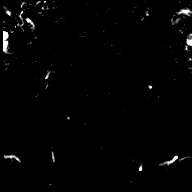
[im 362/580]
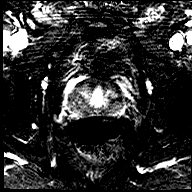
[im 399/580]
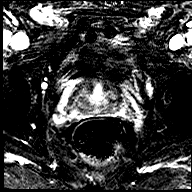
[im 435/580]
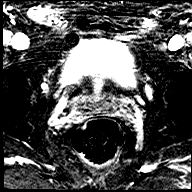
[im 471/580]
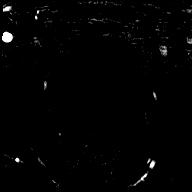
[im 507/580]
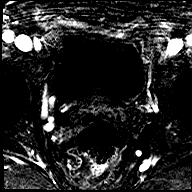
[im 543/580]
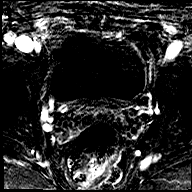
[im 580/580]
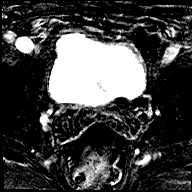

[Series 13: t1_vibe_dixon_tra_f · axial · 2.5mm · 0.91mm/px · z∈[-117,+81]mm · 2 of 80 slices shown]
[im 1/80]
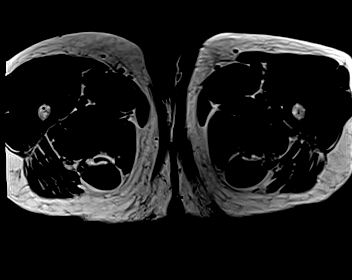
[im 80/80]
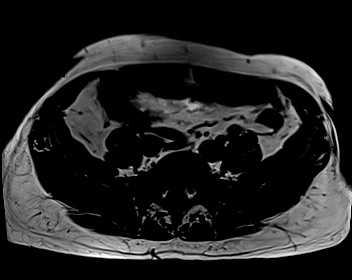

[Series 14: t1_vibe_dixon_tra_w · axial · 2.5mm · 0.91mm/px · z∈[-117,+81]mm · 2 of 80 slices shown]
[im 1/80]
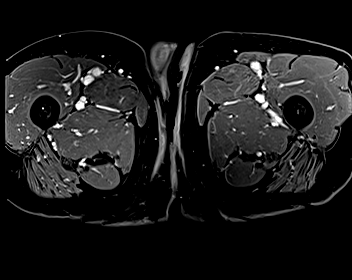
[im 80/80]
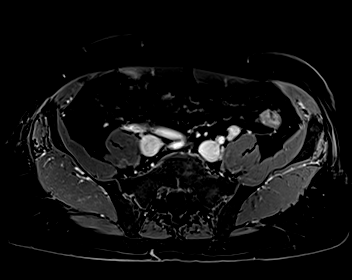

[48 of 48 positions shown; findings below may reference images not displayed]

FINDINGS: Prostate: 5 x 9 mm low T2 lesion in the left lateral mid peripheral
zone (series 8/image 11), with associated mild early arterial
enhancement (series 11/image 91). No restricted diffusion/low ADC.
This likely corresponds to the patient's known small volume,
high-grade macroscopic prostate cancer. PI-RADS 3.

Otherwise, no findings suspicious for macroscopic prostate cancer on
MRI in this patient with known multifocal tumor in the left
peripheral zone.

Mild nodularity of the central gland. No suspicious central gland
nodule on T2.

Volume: 3.4 x 5.1 x 3.6 cm (volume = 33 mL)

Transcapsular spread:  Absent

Seminal vesicle involvement: Absent

Neurovascular bundle involvement: Absent

Pelvic adenopathy: Absent

Bone metastasis: Absent

Other findings: Mildly thick-walled, trabeculated bladder.
IMPRESSION: 9 mm lesion in the lateral left mid peripheral zone, corresponding
to the patient's known small volume, high-grade macroscopic prostate
cancer. Additional known multifocal tumor in the left peripheral
zone is not evident on MRI.

No evidence of extracapsular extension, seminal vesicle invasion, or
metastatic disease.

Calculated prostate volume 33 mL.

## 2020-05-23 MED ORDER — GADOBENATE DIMEGLUMINE 529 MG/ML IV SOLN
13.0000 mL | Freq: Once | INTRAVENOUS | Status: AC | PRN
  Administered 2020-05-23: 13 mL via INTRAVENOUS

## 2020-06-30 ENCOUNTER — Other Ambulatory Visit (HOSPITAL_COMMUNITY): Payer: Self-pay | Admitting: Urology

## 2020-06-30 ENCOUNTER — Other Ambulatory Visit: Payer: Self-pay | Admitting: Urology

## 2020-06-30 DIAGNOSIS — C61 Malignant neoplasm of prostate: Secondary | ICD-10-CM

## 2020-07-15 ENCOUNTER — Encounter: Payer: Self-pay | Admitting: Radiation Oncology

## 2020-07-15 NOTE — Progress Notes (Signed)
GU Location of Tumor / Histology: prostatic adenocarcinoma  If Prostate Cancer, Gleason Score is (4 + 3) and PSA is (11.7). Prostate volume: 34.02 g  Shane Adkins was diagnosed with prostate cancer in 2019 and opted for active surveillance.    Biopsies of prostate (if applicable) revealed:   Past/Anticipated interventions by urology, if any: dx with prostate cancer, active surveillance, scheduled CT and bone scan for 07/20/20, referall to Dr. Tammi Klippel for consideration of external beam radiation therapy  Past/Anticipated interventions by medical oncology, if any: no  Weight changes, if any:   Bowel/Bladder complaints, if any:    Nausea/Vomiting, if any:   Pain issues, if any:    SAFETY ISSUES:  Prior radiation?   Pacemaker/ICD?   Possible current pregnancy? no, male patient  Is the patient on methotrexate?   Current Complaints / other details:  70 year old male. Resides in [place]. Married with 3 daughters. Retired English as a second language teacher.

## 2020-07-16 ENCOUNTER — Ambulatory Visit: Payer: 59

## 2020-07-16 ENCOUNTER — Telehealth: Payer: Self-pay | Admitting: Radiation Oncology

## 2020-07-16 ENCOUNTER — Ambulatory Visit
Admission: RE | Admit: 2020-07-16 | Discharge: 2020-07-16 | Disposition: A | Discharge status: Home / Self Care | Payer: 59 | Source: Ambulatory Visit | Attending: Radiation Oncology | Admitting: Radiation Oncology

## 2020-07-16 HISTORY — DX: Malignant neoplasm of prostate: C61

## 2020-07-16 NOTE — Telephone Encounter (Signed)
Saw where pt was canceled today so I have touched base with him. He states that Dr. Diona Fanti would like to do additional testing and then patient will be out of town so he rec'd our phone# to call us to r/s him for Radiation consult (Dr. Tammi Klippel).

## 2020-07-20 ENCOUNTER — Other Ambulatory Visit (HOSPITAL_COMMUNITY): Payer: 59

## 2020-07-20 ENCOUNTER — Encounter (HOSPITAL_COMMUNITY): Payer: Self-pay

## 2020-07-20 ENCOUNTER — Ambulatory Visit (HOSPITAL_COMMUNITY): Payer: 59

## 2020-09-01 ENCOUNTER — Other Ambulatory Visit: Payer: Self-pay

## 2020-09-01 ENCOUNTER — Telehealth: Payer: Self-pay | Admitting: Radiation Oncology

## 2020-09-01 ENCOUNTER — Ambulatory Visit
Admission: RE | Admit: 2020-09-01 | Discharge: 2020-09-01 | Disposition: A | Discharge status: Home / Self Care | Payer: 59 | Source: Ambulatory Visit | Attending: Radiation Oncology | Admitting: Radiation Oncology

## 2020-09-01 ENCOUNTER — Encounter: Payer: Self-pay | Admitting: Radiation Oncology

## 2020-09-01 VITALS — BP 136/72 | HR 46 | Temp 97.0°F | Resp 18 | Ht 65.0 in | Wt 143.2 lb

## 2020-09-01 DIAGNOSIS — C61 Malignant neoplasm of prostate: Secondary | ICD-10-CM | POA: Diagnosis present

## 2020-09-01 NOTE — Telephone Encounter (Signed)
Received voicemail message for patient requesting a return call. Phoned patient back to inquire. Patient scheduled for a consultation with Dr. Tammi Klippel today. Patient inquires if his wife can accompany him. Explained she may so long as she isn't suffering any covid like symptoms. He verbalized understanding and appreciation for the callback.

## 2020-09-01 NOTE — Progress Notes (Signed)
Radiation Oncology         [phone] ________________________________  Initial outpatient Consultation  Name: Shane Adkins MRN: [id number]  Date: 09/01/2020  DOB: 02/04/1951  WP:[id number], Standley Brooking, MD  Franchot Gallo, MD   REFERRING PHYSICIAN: Franchot Gallo, MD  DIAGNOSIS: 70 y.o. gentleman with stage T1c adenocarcinoma of the prostate with a Gleason's score of 4+3 and a PSA of 11.7    ICD-10-CM   1. Malignant neoplasm of prostate ([place])  [place] is a 70 y.o. gentleman.  He was noted to have an elevated PSA of 6.64 in 2019 by his primary care physician, Dr. Aron Baba.  Accordingly, he was referred for evaluation in urology by Dr. Diona Fanti.  The patient proceeded to transrectal ultrasound with 12 biopsies of the prostate on 06/21/17.  The prostate volume measured 35 cc.  Out of 12 core biopsies, 6 were positive.  The maximum Gleason score was 3+4, and this was seen in only one of the cores.    He elected active surveillance and prostate MRI was done on 06/18/18 showing an estimated prostate volume of 53 mL showing an 11 mm PI-RADS 3 lesion in the left lateral midgland and an 8 mm PI-RADS 3 lesion in the left posterior lateral apex  PSA at that time was 4/71.  The patient proceeded to MRI fusion transrectal ultrasound with with 2 ROI and 12 standard biopsies of the prostate on 07/23/18.  The prostate volume measured 36 cc.  Out of 12 core biopsies, 5 were positive all on the left.  The maximum Gleason score was  3+4, and this was seen in just one core.  The ROI contained 3+3 cancer.  The patient continued surveillance.  PSA on 10/14/19 was 6.61 and 04/24/20 was 11.7.  MRI of the prostate on 05/23/20 showed a 33 cc gland with a 9 mm PI-RADS lesion in the left mid lateral peripheral zone.  The patient proceeded to MRI fusion transrectal ultrasound with 16 biopsies of the prostate on 3//9/22.  The prostate volume measured 34 cc.  All 4 cores  from the region of interest were positive for Gleason 3+4 disease in approximately 50% and out of 12 standard core biopsies, 6 were positive.  The maximum Gleason score was 4+3 , and this was seen in the left lateral apex.   The patient reviewed the biopsy results with his urologist and he has kindly been referred today for discussion of potential radiation treatment options.  Prior to our visit, he sought a second opinion at [facility] with Dr. Deon Pilling of urology.  He got a PSMA PET on 07/24/20 at [facility] which confirmed localized disease in the left prostate and no lymph node or distant involvement.   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    PAST SURGICAL HISTORY: Past Surgical History:  Procedure Laterality Date  . INGUINAL HERNIA REPAIR  2010   Right  . PROSTATE BIOPSY      FAMILY HISTORY: family history includes Other in his father and mother.  SOCIAL HISTORY:  reports that he has never smoked. He has never used smokeless tobacco. He reports current alcohol use. He reports that he does not use drugs.  ALLERGIES: Patient has no known allergies.  MEDICATIONS:  Current Outpatient Medications  Medication Sig  Dispense Refill  . Cholecalciferol (VITAMIN D PO) Take 1 capsule by mouth daily.    . Multiple Vitamin (MULTIVITAMIN WITH MINERALS) TABS Take 1 tablet by mouth daily.    . Omega-3 Fatty Acids (FISH OIL PO) Take 1 tablet by mouth daily.    Marland Kitchen zolpidem (AMBIEN) 10 MG tablet      Current Facility-Administered Medications  Medication Dose Route Frequency Provider Last Rate Last Admin  . 0.9 %  sodium chloride infusion  500 mL Intravenous Continuous Nandigam, Venia Minks, MD        REVIEW OF SYSTEMS:  A 15 point review of systems is documented in the electronic medical record. This was obtained by  the nursing staff. However, I reviewed this with the patient to discuss relevant findings and make appropriate changes.  A comprehensive review of systems was negative..  The patient completed an IPSS and IIEF questionnaire.  His IPSS score was 6 indicating mild urinary outflow obstructive symptoms.  He indicated that his erectile function is 36 which is to complete sexual activity on most attempts.   PHYSICAL EXAM: This patient is in no acute distress.  He is alert and oriented.   height is 5\' 5"  (1.651 m) and weight is 143 lb 4 oz (65 kg). His temporal temperature is 97 F (36.1 C) (abnormal). His blood pressure is 136/72 and his pulse is 46 (abnormal). His respiration is 18 and oxygen saturation is 99%.  He exhibits no respiratory distress or labored breathing.  He appears neurologically intact.  His mood is pleasant.  His affect is appropriate.  Please note the digital rectal exam findings described above.  KPS = 100  100 - Normal; no complaints; no evidence of disease. 90   - Able to carry on normal activity; minor signs or symptoms of disease. 80   - Normal activity with effort; some signs or symptoms of disease. 11   - Cares for self; unable to carry on normal activity or to do active work. 60   - Requires occasional assistance, but is able to care for most of his personal needs. 50   - Requires considerable assistance and frequent medical care. 69   - Disabled; requires special care and assistance. 59   - Severely disabled; hospital admission is indicated although death not imminent. 20   - Very sick; hospital admission necessary; active supportive treatment necessary. 10   - Moribund; fatal processes progressing rapidly. 0     - Dead  Karnofsky DA, Abelmann WH, Craver LS and Burchenal JH [phone]) The use of the nitrogen mustards in the palliative treatment of carcinoma: with particular reference to bronchogenic carcinoma Cancer 1 [id number]   LABORATORY DATA:  No results found for: WBC,  HGB, HCT, MCV, PLT No results found for: NA, K, CL, CO2 No results found for: ALT, AST, GGT, ALKPHOS, BILITOT   RADIOGRAPHY: No results found.    IMPRESSION: This gentleman is a 70 y.o. gentleman with stage T1c adenocarcinoma of the prostate with a Gleason's score of 4+3 and a PSA of 11.7.  His Gleason's Score, and PSA put him into the Unfavorable Intermediate Risk group.  Accordingly he is eligible for a variety of potential treatment options including prostatectomy versus IMRT with ST-ADT.  PLAN:Today I reviewed the findings and workup thus far.  We discussed the natural history of prostate cancer.  We reviewed the the implications of T-stage, Gleason's Score, and PSA on decision-making and outcomes in prostate cancer.  We discussed radiation treatment in  the management of prostate cancer with regard to the logistics and delivery of external beam radiation treatment as well as the logistics and delivery of prostate brachytherapy.  We compared and contrasted each of these approaches and also compared these against prostatectomy.  The patient expressed interest in external beam radiotherapy.  I filled out a patient counseling form for him with relevant treatment diagrams and we retained a copy for our records.   I shared the NCCN guidelines with him which would recommend 4-6 months of ADT and prostate IMRT.  I also shared our institutional option of LDR brachytherapy alone.  The patient has 3 physician daughters and one participated in the conversation today via telephone while his wife was present in the room.  They asked good questions.  At this point they are planning to take some time to discuss and decide about surgery versus radiation.  I enjoyed meeting with him today, and will look forward to participating in the care of this very nice gentleman.   I personally spent 60 minutes in this encounter including chart review, reviewing radiological studies, meeting face-to-face with the patient,  entering orders and completing documentation.   ------------------------------------------------   Tyler Pita, MD [address]: [phone]  Fax: [phone] [email or web address]  Teams  LinkedIn

## 2020-09-01 NOTE — Progress Notes (Signed)
GU Location of Tumor / Histology: prostatic adenocarcinoma  If Prostate Cancer, Gleason Score is (4 + 3) and PSA is (11.7). Prostate volume: 34.02 g  Morad M Stowers was diagnosed with prostate cancer in 2019 and opted for active surveillance.    Biopsies of prostate (if applicable) revealed:   Past/Anticipated interventions by urology, if any: dx with prostate cancer, active surveillance, scheduled CT and bone scan for 07/20/20, referall to Dr. Tammi Klippel for consideration of external beam radiation therapy  Past/Anticipated interventions by medical oncology, if any: no  Weight changes, if any: denies  Bowel/Bladder complaints, if any:  IPSS 6. SHIM 23. Denies dysuria, hematuria, urinary leakage or incontinence. Denies any bowel complaints.  Nausea/Vomiting, if any: denies  Pain issues, if any:  denies  SAFETY ISSUES:  Prior radiation? denies  Pacemaker/ICD? denies  Possible current pregnancy? no, male patient  Is the patient on methotrexate? denies  Current Complaints / other details:  70 year old male. Resides in [place]. Married with 3 daughters. Retired English as a second language teacher.

## 2020-09-24 ENCOUNTER — Other Ambulatory Visit: Payer: Self-pay | Admitting: Urology

## 2020-10-27 NOTE — Progress Notes (Signed)
DUE TO COVID-19 ONLY ONE VISITOR IS ALLOWED TO COME WITH YOU AND STAY IN THE WAITING ROOM ONLY DURING PRE OP AND PROCEDURE DAY OF SURGERY. THE 1 VISITOR  MAY VISIT WITH YOU AFTER SURGERY IN YOUR PRIVATE ROOM DURING VISITING HOURS ONLY!  YOU NEED TO HAVE A COVID 19 TEST ON_______ '@_______'$ , THIS TEST MUST BE DONE BEFORE SURGERY, please see map and take requisition sheet to appt                Shane Adkins  10/27/2020   Your procedure is scheduled on:             11/16/20   Report to [facility] Main  Entrance   Report to admitting at  [facility]   AM     Call this number if you have problems the morning of surgery [phone]    Remember: Do not eat food , candy gum or mints :After Midnight. You may have clear liquids from midnight until  0415 am   Magnesium Citrate by 12 noon day before surgery.  Fleets enema nite before surgery.    CLEAR LIQUID DIET   Foods Allowed                                                                       Coffee and tea, regular and decaf                              Plain Jell-O any favor except red or purple                                            Fruit ices (not with fruit pulp)                                      Iced Popsicles                                     Carbonated beverages, regular and diet                                    Cranberry, grape and apple juices Sports drinks like Gatorade Lightly seasoned clear broth or consume(fat free) Sugar, honey syrup   _____________________________________________________________________    BRUSH YOUR TEETH MORNING OF SURGERY AND RINSE YOUR MOUTH OUT, NO CHEWING GUM CANDY OR MINTS.     Take these medicines the morning of surgery with A SIP OF WATER:    none   DO NOT TAKE ANY DIABETIC MEDICATIONS DAY OF YOUR SURGERY                               You may not have any metal on your body including hair pins and  piercings  Do not wear jewelry, make-up, lotions, powders or  perfumes, deodorant             Do not wear nail polish on your fingernails.  Do not shave  48 hours prior to surgery.              Men may shave face and neck.   Do not bring valuables to the hospital. [place].  Contacts, dentures or bridgework may not be worn into surgery.  Leave suitcase in the car. After surgery it may be brought to your room.     Patients discharged the day of surgery will not be allowed to drive home. IF YOU ARE HAVING SURGERY AND GOING HOME THE SAME DAY, YOU MUST HAVE AN ADULT TO DRIVE YOU HOME AND BE WITH YOU FOR 24 HOURS. YOU MAY GO HOME BY TAXI OR UBER OR ORTHERWISE, BUT AN ADULT MUST ACCOMPANY YOU HOME AND STAY WITH YOU FOR 24 HOURS.  Name and phone number of your driver:  Special Instructions: N/A              Please read over the following fact sheets you were given: _____________________________________________________________________  [facility] - Preparing for Surgery Before surgery, you can play an important role.  Because skin is not sterile, your skin needs to be as free of germs as possible.  You can reduce the number of germs on your skin by washing with CHG (chlorahexidine gluconate) soap before surgery.  CHG is an antiseptic cleaner which kills germs and bonds with the skin to continue killing germs even after washing. Please DO NOT use if you have an allergy to CHG or antibacterial soaps.  If your skin becomes reddened/irritated stop using the CHG and inform your nurse when you arrive at Short Stay. Do not shave (including legs and underarms) for at least 48 hours prior to the first CHG shower.  You may shave your face/neck. Please follow these instructions carefully:  1.  Shower with CHG Soap the night before surgery and the  morning of Surgery.  2.  If you choose to wash your hair, wash your hair first as usual with your  normal  shampoo.  3.  After you shampoo, rinse your hair and body thoroughly  to remove the  shampoo.                           4.  Use CHG as you would any other liquid soap.  You can apply chg directly  to the skin and wash                       Gently with a scrungie or clean washcloth.  5.  Apply the CHG Soap to your body ONLY FROM THE NECK DOWN.   Do not use on face/ open                           Wound or open sores. Avoid contact with eyes, ears mouth and genitals (private parts).                       Wash face,  Genitals (private parts) with your normal soap.  6.  Wash thoroughly, paying special attention to the area where your surgery  will be performed.  7.  Thoroughly rinse your body with warm water from the neck down.  8.  DO NOT shower/wash with your normal soap after using and rinsing off  the CHG Soap.                9.  Pat yourself dry with a clean towel.            10.  Wear clean pajamas.            11.  Place clean sheets on your bed the night of your first shower and do not  sleep with pets. Day of Surgery : Do not apply any lotions/deodorants the morning of surgery.  Please wear clean clothes to the hospital/surgery center.  FAILURE TO FOLLOW THESE INSTRUCTIONS MAY RESULT IN THE CANCELLATION OF YOUR SURGERY PATIENT SIGNATURE_________________________________  NURSE SIGNATURE__________________________________  ________________________________________________________________________

## 2020-10-29 ENCOUNTER — Encounter (HOSPITAL_COMMUNITY): Payer: Self-pay

## 2020-10-29 ENCOUNTER — Other Ambulatory Visit: Payer: Self-pay

## 2020-10-29 ENCOUNTER — Encounter (HOSPITAL_COMMUNITY)
Admission: RE | Admit: 2020-10-29 | Discharge: 2020-10-29 | Disposition: A | Discharge status: Home / Self Care | Payer: 59 | Source: Ambulatory Visit | Attending: Urology | Admitting: Urology

## 2020-10-29 DIAGNOSIS — Z01812 Encounter for preprocedural laboratory examination: Secondary | ICD-10-CM | POA: Diagnosis present

## 2020-10-29 LAB — BASIC METABOLIC PANEL
Anion gap: 9 (ref 5–15)
BUN: 18 mg/dL (ref 8–23)
CO2: 23 mmol/L (ref 22–32)
Calcium: 9.3 mg/dL (ref 8.9–10.3)
Chloride: 104 mmol/L (ref 98–111)
Creatinine, Ser: 0.58 mg/dL — ABNORMAL LOW (ref 0.61–1.24)
GFR, Estimated: >60 mL/min (ref >60)
Glucose, Bld: 98 mg/dL (ref 70–99)
Potassium: 4.5 mmol/L (ref 3.5–5.1)
Sodium: 136 mmol/L (ref 135–145)

## 2020-10-29 LAB — CBC
HCT: 42 % (ref 39.0–52.0)
Hemoglobin: 13.6 g/dL (ref 13.0–17.0)
MCH: 32.5 pg (ref 26.0–34.0)
MCHC: 32.4 g/dL (ref 30.0–36.0)
MCV: 100.5 fL — ABNORMAL HIGH (ref 80.0–100.0)
Platelets: 234 10*3/uL (ref 150–400)
RBC: 4.18 MIL/uL — ABNORMAL LOW (ref 4.22–5.81)
RDW: 12.8 % (ref 11.5–15.5)
WBC: 5.2 10*3/uL (ref 4.0–10.5)
nRBC: 0 % (ref 0.0–0.2)

## 2020-10-29 NOTE — Progress Notes (Signed)
Anesthesia Review:  PCP: DR Shanon Ace- has not seen per pt  Does not really have PCP  Cardiologist : nonen  Chest x-ray : EKG : Echo : Stress test: Cardiac Cath :  Activity level: can do a flgiht of stairs without difficulty  Sleep Study/ CPAP : none  Fasting Blood Sugar :      / Checks Blood Sugar -- times a day:   Blood Thinner/ Instructions /Last Dose: ASA / Instructions/ Last Dose :

## 2020-11-12 ENCOUNTER — Other Ambulatory Visit: Payer: Self-pay | Admitting: Urology

## 2020-11-12 LAB — SARS CORONAVIRUS 2 (TAT 6-24 HRS): SARS Coronavirus 2: NEGATIVE

## 2020-11-13 NOTE — H&P (Signed)
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 He only had one core of 3+4=7 disease with relatively low percentage pattern 4 disease. He elected active surveillance initially. Oncotype Dx testing indicated a GPS of 10 which equated to a 12% risk of higher grade disease and 13% risk of locally advanced disease. He underwent an MRI of the prostate in March 2020 that demonstrated two PI-RADS 3 lesions at the left lateral mid and left lateral apex gland. An MR/US fusion biopsy on 07/23/18 demonstrated 4 out of the 6 targeted (all of the ROI-1 biopsies) to be positive for Gleason 3+3=6 disease. Again, only one core was positive for Gleason 3+4=7 disease with the systematic biopsies with 5 out of 12 positive. His PSA significantly increased to 11.7 in February 2022 prompting another MRI on 05/23/20. His MRI showed the same left lateral mid PI-RADS 3 lesion but the apical lesion was not reported. A repeat fusion biopsy was then performed on 06/03/20. This demonstrated upgraded Gleason 4+3=7 adenocarcinoma. On the targeted biopsies 4 out of 4 were positive and all showed Gleason 3+4=7 disease. 6 out of 12 systematic biopsies were positive with one now showing 4+3=7 disease at the left lateral apex with PNI. Two other systematic biopsies were now positive for Gleason 3+4=7 disease also. He now presents to discuss his options for definitive therapy. He recently went to [facility] for an opinion with Medical Oncology. His daughter is getting ready to start her Oncology fellowship there. He did proceed with a PSMA PET scan the demonstrate uptake within the prostate but without evidence to suggest metastatic disease.   Family history: None.   Imaging  studies:  MRI (05/23/20): No EPE, SVI, LAD, or bone lesions.  PSMA PET scan: Negative for metastatic disease.   PMH: He has no major medical comorbities.  PSH: Laparoscopic inguinal hernia repair.   TNM stage: cT1c Nx Mx  PSA: 11.7  Gleason score: 4+3=7 (GG 3)  Biopsy (23/9/22): 10/16 cores positive  Left: L lateral apex (30%, 4+3=7, PNI), L apex (50%, 3+4=7), L lateral mid (60%, 3+4=7), L mid (10%, 3+3=6), L base (10$, 3+3=6)  Right: R mid (10%, 3+3=6)  Targeted (LLM): 4/4 cores - 50% of tissue with 3+4=7 in all cores  Prostate volume: 34 cc  PSAD: 0.34   Nomogram  OC disease: 46%  EPE: 50%  SVI: 7%  LNI: 8%  PFS (5 year, 10 year): 82%, 76%   Urinary function: IPSS is 9.  Erectile function: SHIM score is 21.     ALLERGIES: None    MEDICATIONS: Multiple Vitamin  Vitamin D3  Vitamin K     GU PSH: Prostate Needle Biopsy - 06/03/2020, 2020, 2019     NON-GU PSH: Inguinal Hernia Repair > 5 yrs Surgical Pathology, Gross And Microscopic Examination For Prostate Needle - 06/03/2020, 2020, 2019     GU PMH: Prostate Cancer, Unfavorable intermediate risk prostate cancer, upgrading from prior biopsy. Progression free probability after radical prostatectomy at 5/10 years- 47/32% respectively lymph nodes/seminal vesicle involvement - 20/23% respectively - 06/26/2020, - 06/03/2020, favorable risk disease, on active surveillance. PSA recently up somewhat. Stable/benign DRE. He is due a repeat MRI/ fusion biopsy., - 05/01/2020, Relatively low  risk prostate cancer, on active surveillance with stable PSA curve/DRE. We will need to continue surveillance with prostate MRI and fusion biopsy in about 6 months, - 10/21/2019, Relatively low risk, moderate volume prostate cancer. He has had fusion biopsy just under a year ago with similar pathology as his original biopsy. Currently exam is stable., - 04/22/2019, - 2020 (Stable), Relatively low risk prostate cancer, on active surveillance, stable exam, PSA  slightly lower than at time of diagnosis, - 2019, New diagnosis of adenocarcinoma prostate. PSA 5.1, prostatic volume 45 mL, PSA density 0.11. 6/12 cores positive for adenocarcinoma, Gleason 3+3 = 6 pattern (1 showing perineural invasion), 1 revealing Gleason 3+4 = 7 pattern. Excellent voiding and erectile function Kattan predictions: Organ confined disease 39%, lymph node/seminal vesicle involvement 4/5%, respectively. Progression free probability after radical prostatectomy at 5/10 years 82/70% respectively., - 2019 Elevated PSA - 2019, (Worsening), Elevating trend PSA. Benign exam, but in this 70 year old male, I would recommend ultrasound and biopsy, - 2018, Elevating PSA trend, up from 3.0-4.7 and a period of approximately 4 years. Benign feeling, normal size gland., - 2017    NON-GU PMH: No Non-GU PMH    FAMILY HISTORY: 3 daughters - Daughter Death In The Family Father - Father Death In The Family Mother - Mother   SOCIAL HISTORY: Marital Status: Married Preferred Language: English; Ethnicity: Not Hispanic Or Latino; Race: Other Race Current Smoking Status: Patient has never smoked.  Social Drinker.  Drinks 3 caffeinated drinks per day. Patient's occupation Restaurant manager, fast food.    REVIEW OF SYSTEMS:    GU Review Male:   Patient denies frequent urination, hard to postpone urination, burning/ pain with urination, get up at night to urinate, leakage of urine, stream starts and stops, trouble starting your streams, and have to strain to urinate .  Gastrointestinal (Lower):   Patient denies diarrhea and constipation.  Gastrointestinal (Upper):   Patient denies nausea and vomiting.  Constitutional:   Patient denies fever, night sweats, weight loss, and fatigue.  Skin:   Patient denies skin rash/ lesion and itching.  Eyes:   Patient denies blurred vision and double vision.  Ears/ Nose/ Throat:   Patient denies sore throat and sinus problems.  Hematologic/Lymphatic:   Patient denies swollen glands  and easy bruising.  Cardiovascular:   Patient denies leg swelling and chest pains.  Respiratory:   Patient denies cough and shortness of breath.  Endocrine:   Patient denies excessive thirst.  Musculoskeletal:   Patient denies joint pain and back pain.  Neurological:   Patient denies headaches and dizziness.  Psychologic:   Patient denies depression and anxiety.    Weight 145 lb / 65.77 kg  Height 65 in / 165.1 cm  BMI 24.1 kg/m    MULTI-SYSTEM PHYSICAL EXAMINATION:    Constitutional: Well-nourished. No physical deformities. Normally developed. Good grooming.  Respiratory: No labored breathing, no use of accessory muscles. Clear bilaterally.  Cardiovascular: Normal temperature, normal extremity pulses, no swelling, no varicosities. Regular rate and rhythm.    ASSESSMENT:      ICD-10 Details  1 GU:   Prostate Cancer - C61    PLAN:      1. Unfavorable intermediate risk prostate cancer: He has made the decision to proceed with surgical therapy and will undergo a bilateral nerve-sparing robot assisted laparoscopic radical prostatectomy and bilateral pelvic lymphadenectomy. I discussed the potential benefits and risks of the procedure, side effects of the proposed treatment, the likelihood of the patient achieving the goals of the  procedure, and any potential problems that might occur during the procedure or recuperation.

## 2020-11-15 ENCOUNTER — Encounter (HOSPITAL_COMMUNITY): Payer: Self-pay | Admitting: Urology

## 2020-11-15 NOTE — Anesthesia Preprocedure Evaluation (Addendum)
Anesthesia Evaluation  Patient identified by MRN, date of birth, ID band Patient awake    Reviewed: Allergy & Precautions, NPO status , Patient's Chart, lab work & pertinent test results  Airway Mallampati: II  TM Distance: >3 FB Neck ROM: Full    Dental no notable dental hx.    Pulmonary neg pulmonary ROS,    Pulmonary exam normal breath sounds clear to auscultation       Cardiovascular negative cardio ROS Normal cardiovascular exam Rhythm:Regular Rate:Normal     Neuro/Psych negative neurological ROS  negative psych ROS   GI/Hepatic negative GI ROS, Neg liver ROS,   Endo/Other  negative endocrine ROS  Renal/GU negative Renal ROS   Prostate cancer    Musculoskeletal negative musculoskeletal ROS (+)   Abdominal   Peds negative pediatric ROS (+)  Hematology negative hematology ROS (+)   Anesthesia Other Findings   Reproductive/Obstetrics negative OB ROS                            Anesthesia Physical Anesthesia Plan  ASA: 2  Anesthesia Plan: General   Post-op Pain Management:    Induction: Intravenous  PONV Risk Score and Plan: 2 and Ondansetron, Dexamethasone, Midazolam and Treatment may vary due to age or medical condition  Airway Management Planned: Oral ETT  Additional Equipment: None  Intra-op Plan:   Post-operative Plan: Extubation in OR  Informed Consent:     Dental advisory given  Plan Discussed with: Anesthesiologist, CRNA and Surgeon  Anesthesia Plan Comments:        Anesthesia Quick Evaluation

## 2020-11-16 ENCOUNTER — Encounter (HOSPITAL_COMMUNITY): Payer: Self-pay | Admitting: Urology

## 2020-11-16 ENCOUNTER — Ambulatory Visit (HOSPITAL_COMMUNITY): Payer: 59 | Admitting: Certified Registered Nurse Anesthetist

## 2020-11-16 ENCOUNTER — Encounter (HOSPITAL_COMMUNITY)
Admission: RE | Disposition: A | Discharge status: Home / Self Care | Payer: Self-pay | Source: Home / Self Care | Attending: Urology

## 2020-11-16 ENCOUNTER — Other Ambulatory Visit: Payer: Self-pay

## 2020-11-16 ENCOUNTER — Observation Stay (HOSPITAL_COMMUNITY)
Admission: RE | Admit: 2020-11-16 | Discharge: 2020-11-17 | Disposition: A | Discharge status: Home / Self Care | Payer: 59 | Attending: Urology | Admitting: Urology

## 2020-11-16 DIAGNOSIS — C61 Malignant neoplasm of prostate: Secondary | ICD-10-CM | POA: Diagnosis not present

## 2020-11-16 HISTORY — PX: ROBOT ASSISTED LAPAROSCOPIC RADICAL PROSTATECTOMY: SHX5141

## 2020-11-16 LAB — HEMOGLOBIN AND HEMATOCRIT, BLOOD
HCT: 38.9 % — ABNORMAL LOW (ref 39.0–52.0)
Hemoglobin: 12.3 g/dL — ABNORMAL LOW (ref 13.0–17.0)

## 2020-11-16 LAB — TYPE AND SCREEN
ABO/RH(D): B POS
Antibody Screen: NEGATIVE

## 2020-11-16 LAB — ABO/RH: ABO/RH(D): B POS

## 2020-11-16 SURGERY — XI ROBOTIC ASSISTED LAPAROSCOPIC RADICAL PROSTATECTOMY LEVEL 2
Anesthesia: General

## 2020-11-16 MED ORDER — PHENYLEPHRINE HCL (PRESSORS) 10 MG/ML IV SOLN
INTRAVENOUS | Status: AC
  Filled 2020-11-16: qty 1

## 2020-11-16 MED ORDER — ONDANSETRON HCL 4 MG/2ML IJ SOLN: 4.0000 mg | INTRAMUSCULAR | Status: DC | PRN

## 2020-11-16 MED ORDER — SODIUM CHLORIDE 0.9 % IV BOLUS: 1000.0000 mL | Freq: Once | INTRAVENOUS | Status: DC

## 2020-11-16 MED ORDER — ONDANSETRON HCL 4 MG/2ML IJ SOLN
INTRAMUSCULAR | Status: AC
  Filled 2020-11-16: qty 2

## 2020-11-16 MED ORDER — SODIUM CHLORIDE 0.9 % IR SOLN
Status: DC | PRN
  Administered 2020-11-16: 1000 mL via INTRAVESICAL

## 2020-11-16 MED ORDER — PROPOFOL 10 MG/ML IV BOLUS
INTRAVENOUS | Status: AC
  Filled 2020-11-16: qty 20

## 2020-11-16 MED ORDER — MIDAZOLAM HCL 2 MG/2ML IJ SOLN
INTRAMUSCULAR | Status: AC
  Filled 2020-11-16: qty 2

## 2020-11-16 MED ORDER — ROCURONIUM BROMIDE 10 MG/ML (PF) SYRINGE
PREFILLED_SYRINGE | INTRAVENOUS | Status: DC | PRN
  Administered 2020-11-16: 10 mg via INTRAVENOUS
  Administered 2020-11-16: 5 mg via INTRAVENOUS
  Administered 2020-11-16: 10 mg via INTRAVENOUS
  Administered 2020-11-16: 80 mg via INTRAVENOUS
  Administered 2020-11-16: 10 mg via INTRAVENOUS

## 2020-11-16 MED ORDER — DIPHENHYDRAMINE HCL 50 MG/ML IJ SOLN: 12.5000 mg | Freq: Four times a day (QID) | INTRAMUSCULAR | Status: DC | PRN

## 2020-11-16 MED ORDER — FENTANYL CITRATE (PF) 250 MCG/5ML IJ SOLN
INTRAMUSCULAR | Status: AC
  Filled 2020-11-16: qty 5

## 2020-11-16 MED ORDER — ROCURONIUM BROMIDE 10 MG/ML (PF) SYRINGE
PREFILLED_SYRINGE | INTRAVENOUS | Status: AC
  Filled 2020-11-16: qty 10

## 2020-11-16 MED ORDER — CEFAZOLIN SODIUM-DEXTROSE 1-4 GM/50ML-% IV SOLN
1.0000 g | Freq: Three times a day (TID) | INTRAVENOUS | Status: AC
  Administered 2020-11-16 – 2020-11-17 (×2): 1 g via INTRAVENOUS
  Filled 2020-11-16 (×2): qty 50

## 2020-11-16 MED ORDER — LIDOCAINE 2% (20 MG/ML) 5 ML SYRINGE
INTRAMUSCULAR | Status: AC
  Filled 2020-11-16: qty 5

## 2020-11-16 MED ORDER — SULFAMETHOXAZOLE-TRIMETHOPRIM 800-160 MG PO TABS: 1.0000 | ORAL_TABLET | Freq: Two times a day (BID) | ORAL | 0 refills | Status: AC

## 2020-11-16 MED ORDER — PROPOFOL 10 MG/ML IV BOLUS
INTRAVENOUS | Status: DC | PRN
  Administered 2020-11-16: 150 mg via INTRAVENOUS

## 2020-11-16 MED ORDER — BUPIVACAINE-EPINEPHRINE (PF) 0.25% -1:200000 IJ SOLN
INTRAMUSCULAR | Status: AC
  Filled 2020-11-16: qty 30

## 2020-11-16 MED ORDER — DOCUSATE SODIUM 100 MG PO CAPS
100.0000 mg | ORAL_CAPSULE | Freq: Two times a day (BID) | ORAL | Status: DC
  Administered 2020-11-16 – 2020-11-17 (×2): 100 mg via ORAL
  Filled 2020-11-16 (×2): qty 1

## 2020-11-16 MED ORDER — FENTANYL CITRATE (PF) 100 MCG/2ML IJ SOLN
INTRAMUSCULAR | Status: AC
  Filled 2020-11-16: qty 2

## 2020-11-16 MED ORDER — LACTATED RINGERS IV SOLN: INTRAVENOUS | Status: DC

## 2020-11-16 MED ORDER — AMISULPRIDE (ANTIEMETIC) 5 MG/2ML IV SOLN: 10.0000 mg | Freq: Once | INTRAVENOUS | Status: DC | PRN

## 2020-11-16 MED ORDER — DIPHENHYDRAMINE HCL 12.5 MG/5ML PO ELIX: 12.5000 mg | ORAL_SOLUTION | Freq: Four times a day (QID) | ORAL | Status: DC | PRN

## 2020-11-16 MED ORDER — LACTATED RINGERS IV SOLN
INTRAVENOUS | Status: DC | PRN
  Administered 2020-11-16: 1000 mL

## 2020-11-16 MED ORDER — OXYCODONE HCL 5 MG/5ML PO SOLN: 5.0000 mg | Freq: Once | ORAL | Status: DC | PRN

## 2020-11-16 MED ORDER — PHENYLEPHRINE 40 MCG/ML (10ML) SYRINGE FOR IV PUSH (FOR BLOOD PRESSURE SUPPORT)
PREFILLED_SYRINGE | INTRAVENOUS | Status: DC | PRN
Start: 2020-11-16 — End: 2020-11-16
  Administered 2020-11-16 (×3): 80 ug via INTRAVENOUS

## 2020-11-16 MED ORDER — MENTHOL 3 MG MT LOZG
1.0000 | LOZENGE | OROMUCOSAL | Status: DC | PRN
  Administered 2020-11-16: 3 mg via ORAL
  Filled 2020-11-16: qty 9

## 2020-11-16 MED ORDER — ONDANSETRON HCL 4 MG/2ML IJ SOLN
INTRAMUSCULAR | Status: DC | PRN
  Administered 2020-11-16: 4 mg via INTRAVENOUS

## 2020-11-16 MED ORDER — ORAL CARE MOUTH RINSE: 15.0000 mL | Freq: Once | OROMUCOSAL | Status: AC

## 2020-11-16 MED ORDER — GLYCOPYRROLATE PF 0.2 MG/ML IJ SOSY
PREFILLED_SYRINGE | INTRAMUSCULAR | Status: DC | PRN
  Administered 2020-11-16: .2 mg via INTRAVENOUS

## 2020-11-16 MED ORDER — LIDOCAINE 2% (20 MG/ML) 5 ML SYRINGE
INTRAMUSCULAR | Status: DC | PRN
  Administered 2020-11-16: 80 mg via INTRAVENOUS

## 2020-11-16 MED ORDER — DEXAMETHASONE SODIUM PHOSPHATE 10 MG/ML IJ SOLN
INTRAMUSCULAR | Status: DC | PRN
  Administered 2020-11-16: 8 mg via INTRAVENOUS

## 2020-11-16 MED ORDER — FLEET ENEMA 7-19 GM/118ML RE ENEM: 1.0000 | ENEMA | Freq: Once | RECTAL | Status: DC

## 2020-11-16 MED ORDER — PHENYLEPHRINE 40 MCG/ML (10ML) SYRINGE FOR IV PUSH (FOR BLOOD PRESSURE SUPPORT)
PREFILLED_SYRINGE | INTRAVENOUS | Status: AC
  Filled 2020-11-16: qty 10

## 2020-11-16 MED ORDER — SUGAMMADEX SODIUM 200 MG/2ML IV SOLN
INTRAVENOUS | Status: DC | PRN
  Administered 2020-11-16: 200 mg via INTRAVENOUS

## 2020-11-16 MED ORDER — CEFAZOLIN SODIUM-DEXTROSE 2-4 GM/100ML-% IV SOLN
2.0000 g | Freq: Once | INTRAVENOUS | Status: AC
  Administered 2020-11-16: 2 g via INTRAVENOUS

## 2020-11-16 MED ORDER — EPHEDRINE SULFATE-NACL 50-0.9 MG/10ML-% IV SOSY
PREFILLED_SYRINGE | INTRAVENOUS | Status: DC | PRN
  Administered 2020-11-16: 5 mg via INTRAVENOUS

## 2020-11-16 MED ORDER — PHENYLEPHRINE HCL-NACL 20-0.9 MG/250ML-% IV SOLN
INTRAVENOUS | Status: DC | PRN
  Administered 2020-11-16: 40 ug/min via INTRAVENOUS

## 2020-11-16 MED ORDER — OXYCODONE HCL 5 MG PO TABS
5.0000 mg | ORAL_TABLET | Freq: Once | ORAL | Status: DC | PRN
Start: 2020-11-16 — End: 2020-11-16

## 2020-11-16 MED ORDER — ZOLPIDEM TARTRATE 5 MG PO TABS
5.0000 mg | ORAL_TABLET | Freq: Every evening | ORAL | Status: DC | PRN
  Administered 2020-11-16: 5 mg via ORAL
  Filled 2020-11-16: qty 1

## 2020-11-16 MED ORDER — HEPARIN SODIUM (PORCINE) 1000 UNIT/ML IJ SOLN
INTRAMUSCULAR | Status: AC
  Filled 2020-11-16: qty 1

## 2020-11-16 MED ORDER — STERILE WATER FOR IRRIGATION IR SOLN
Status: DC | PRN
  Administered 2020-11-16: 1000 mL

## 2020-11-16 MED ORDER — KCL IN DEXTROSE-NACL 20-5-0.45 MEQ/L-%-% IV SOLN
INTRAVENOUS | Status: DC
  Filled 2020-11-16 (×4): qty 1000

## 2020-11-16 MED ORDER — DEXAMETHASONE SODIUM PHOSPHATE 10 MG/ML IJ SOLN
INTRAMUSCULAR | Status: AC
  Filled 2020-11-16: qty 1

## 2020-11-16 MED ORDER — CHLORHEXIDINE GLUCONATE 0.12 % MT SOLN
15.0000 mL | Freq: Once | OROMUCOSAL | Status: AC
  Administered 2020-11-16: 15 mL via OROMUCOSAL

## 2020-11-16 MED ORDER — MIDAZOLAM HCL 5 MG/5ML IJ SOLN
INTRAMUSCULAR | Status: DC | PRN
Start: 2020-11-16 — End: 2020-11-16
  Administered 2020-11-16 (×2): 1 mg via INTRAVENOUS

## 2020-11-16 MED ORDER — BACITRACIN-NEOMYCIN-POLYMYXIN 400-5-5000 EX OINT: 1.0000 "application " | TOPICAL_OINTMENT | Freq: Three times a day (TID) | CUTANEOUS | Status: DC | PRN

## 2020-11-16 MED ORDER — TRAMADOL HCL 50 MG PO TABS: 50.0000 mg | ORAL_TABLET | Freq: Four times a day (QID) | ORAL | 0 refills | Status: AC | PRN

## 2020-11-16 MED ORDER — KETOROLAC TROMETHAMINE 15 MG/ML IJ SOLN
15.0000 mg | Freq: Four times a day (QID) | INTRAMUSCULAR | Status: DC
  Administered 2020-11-16 – 2020-11-17 (×4): 15 mg via INTRAVENOUS
  Filled 2020-11-16 (×4): qty 1

## 2020-11-16 MED ORDER — CHLORHEXIDINE GLUCONATE CLOTH 2 % EX PADS
6.0000 | MEDICATED_PAD | Freq: Every day | CUTANEOUS | Status: DC
  Administered 2020-11-16 – 2020-11-17 (×2): 6 via TOPICAL

## 2020-11-16 MED ORDER — ACETAMINOPHEN 325 MG PO TABS
650.0000 mg | ORAL_TABLET | ORAL | Status: DC | PRN
Start: 2020-11-16 — End: 2020-11-17

## 2020-11-16 MED ORDER — PROMETHAZINE HCL 25 MG/ML IJ SOLN: 6.2500 mg | INTRAMUSCULAR | Status: DC | PRN

## 2020-11-16 MED ORDER — BELLADONNA ALKALOIDS-OPIUM 16.2-60 MG RE SUPP
1.0000 | Freq: Four times a day (QID) | RECTAL | Status: DC | PRN
  Administered 2020-11-16: 1 via RECTAL
  Filled 2020-11-16: qty 1

## 2020-11-16 MED ORDER — FENTANYL CITRATE (PF) 100 MCG/2ML IJ SOLN: 25.0000 ug | INTRAMUSCULAR | Status: DC | PRN

## 2020-11-16 MED ORDER — BUPIVACAINE-EPINEPHRINE (PF) 0.25% -1:200000 IJ SOLN
INTRAMUSCULAR | Status: DC | PRN
  Administered 2020-11-16: 30 mL

## 2020-11-16 MED ORDER — FENTANYL CITRATE (PF) 100 MCG/2ML IJ SOLN
INTRAMUSCULAR | Status: DC | PRN
  Administered 2020-11-16 (×6): 50 ug via INTRAVENOUS

## 2020-11-16 MED ORDER — CEFAZOLIN SODIUM-DEXTROSE 2-4 GM/100ML-% IV SOLN
INTRAVENOUS | Status: AC
  Filled 2020-11-16: qty 100

## 2020-11-16 MED ORDER — MORPHINE SULFATE (PF) 2 MG/ML IV SOLN
2.0000 mg | INTRAVENOUS | Status: DC | PRN
  Administered 2020-11-16 (×2): 2 mg via INTRAVENOUS
  Filled 2020-11-16 (×2): qty 1

## 2020-11-16 MED ORDER — MAGNESIUM CITRATE PO SOLN: 1.0000 | Freq: Once | ORAL | Status: DC

## 2020-11-16 SURGICAL SUPPLY — 62 items
APPLICATOR COTTON TIP 6 STRL (MISCELLANEOUS) ×2 IMPLANT
APPLICATOR COTTON TIP 6IN STRL (MISCELLANEOUS) ×3
BAG COUNTER SPONGE SURGICOUNT (BAG) IMPLANT
CATH FOLEY 2WAY SLVR 18FR 30CC (CATHETERS) ×3 IMPLANT
CATH ROBINSON RED A/P 16FR (CATHETERS) ×3 IMPLANT
CATH ROBINSON RED A/P 8FR (CATHETERS) ×3 IMPLANT
CATH TIEMANN FOLEY 18FR 5CC (CATHETERS) ×3 IMPLANT
CHLORAPREP W/TINT 26 (MISCELLANEOUS) ×3 IMPLANT
CLIP LIGATING HEMO O LOK GREEN (MISCELLANEOUS) ×6 IMPLANT
COVER SURGICAL LIGHT HANDLE (MISCELLANEOUS) ×3 IMPLANT
COVER TIP SHEARS 8 DVNC (MISCELLANEOUS) ×2 IMPLANT
COVER TIP SHEARS 8MM DA VINCI (MISCELLANEOUS) ×3
CUTTER ECHEON FLEX ENDO 45 340 (ENDOMECHANICALS) ×3 IMPLANT
DECANTER SPIKE VIAL GLASS SM (MISCELLANEOUS) ×3 IMPLANT
DERMABOND ADVANCED (GAUZE/BANDAGES/DRESSINGS) ×1
DERMABOND ADVANCED .7 DNX12 (GAUZE/BANDAGES/DRESSINGS) ×2 IMPLANT
DRAIN CHANNEL RND F F (WOUND CARE) IMPLANT
DRAPE ARM DVNC X/XI (DISPOSABLE) ×8 IMPLANT
DRAPE COLUMN DVNC XI (DISPOSABLE) ×2 IMPLANT
DRAPE DA VINCI XI ARM (DISPOSABLE) ×12
DRAPE DA VINCI XI COLUMN (DISPOSABLE) ×3
DRAPE SURG IRRIG POUCH 19X23 (DRAPES) ×3 IMPLANT
DRSG TEGADERM 4X4.75 (GAUZE/BANDAGES/DRESSINGS) ×3 IMPLANT
ELECT PENCIL ROCKER SW 15FT (MISCELLANEOUS) ×3 IMPLANT
ELECT REM PT RETURN 15FT ADLT (MISCELLANEOUS) ×3 IMPLANT
GLOVE SURG ENC MOIS LTX SZ6.5 (GLOVE) ×3 IMPLANT
GLOVE SURG ENC TEXT LTX SZ7.5 (GLOVE) ×6 IMPLANT
GOWN STRL REUS W/TWL LRG LVL3 (GOWN DISPOSABLE) ×9 IMPLANT
HEMOSTAT SURGICEL 2X3 (HEMOSTASIS) ×3 IMPLANT
HEMOSTAT SURGICEL 4X8 (HEMOSTASIS) ×3 IMPLANT
HOLDER FOLEY CATH W/STRAP (MISCELLANEOUS) ×3 IMPLANT
IRRIG SUCT STRYKERFLOW 2 WTIP (MISCELLANEOUS) ×3
IRRIGATION SUCT STRKRFLW 2 WTP (MISCELLANEOUS) ×2 IMPLANT
IV LACTATED RINGERS 1000ML (IV SOLUTION) ×3 IMPLANT
KIT TURNOVER KIT A (KITS) ×3 IMPLANT
NDL SAFETY ECLIPSE 18X1.5 (NEEDLE) ×2 IMPLANT
NEEDLE HYPO 18GX1.5 SHARP (NEEDLE) ×3
PACK ROBOT UROLOGY CUSTOM (CUSTOM PROCEDURE TRAY) ×3 IMPLANT
PENCIL SMOKE EVACUATOR (MISCELLANEOUS) IMPLANT
SEAL CANN UNIV 5-8 DVNC XI (MISCELLANEOUS) ×8 IMPLANT
SEAL XI 5MM-8MM UNIVERSAL (MISCELLANEOUS) ×12
SET TUBE SMOKE EVAC HIGH FLOW (TUBING) ×3 IMPLANT
SOLUTION ELECTROLUBE (MISCELLANEOUS) ×3 IMPLANT
STAPLE RELOAD 45 GRN (STAPLE) ×2 IMPLANT
STAPLE RELOAD 45MM GREEN (STAPLE) ×3
SUT ETHILON 3 0 PS 1 (SUTURE) ×3 IMPLANT
SUT MNCRL 3 0 RB1 (SUTURE) ×2 IMPLANT
SUT MNCRL 3 0 VIOLET RB1 (SUTURE) ×2 IMPLANT
SUT MNCRL AB 4-0 PS2 18 (SUTURE) ×6 IMPLANT
SUT MONOCRYL 3 0 RB1 (SUTURE) ×6
SUT VIC AB 0 CT1 27 (SUTURE) ×3
SUT VIC AB 0 CT1 27XBRD ANTBC (SUTURE) ×2 IMPLANT
SUT VIC AB 0 UR5 27 (SUTURE) ×3 IMPLANT
SUT VIC AB 2-0 SH 27 (SUTURE) ×3
SUT VIC AB 2-0 SH 27X BRD (SUTURE) ×2 IMPLANT
SUT VIC AB 3-0 SH 27 (SUTURE) ×3
SUT VIC AB 3-0 SH 27XBRD (SUTURE) ×2 IMPLANT
SUT VICRYL 0 UR6 27IN ABS (SUTURE) ×6 IMPLANT
SYR 27GX1/2 1ML LL SAFETY (SYRINGE) ×3 IMPLANT
TOWEL OR NON WOVEN STRL DISP B (DISPOSABLE) ×3 IMPLANT
TROCAR XCEL NON-BLD 5MMX100MML (ENDOMECHANICALS) IMPLANT
WATER STERILE IRR 1000ML POUR (IV SOLUTION) ×3 IMPLANT

## 2020-11-16 NOTE — Progress Notes (Signed)
Patient ID: Shane Adkins, male   DOB: 01-28-1951, 70 y.o.   MRN: [id number] Post-op note  Subjective: The patient is doing well.  No complaints.  Objective: Vital signs in last 24 hours: Temp:  [96.9 F (36.1 C)-98.1 F (36.7 C)] 97.5 F (36.4 C) (08/22 1215) Pulse Rate:  [45-68] 55 (08/22 1215) Resp:  [16-27] 18 (08/22 1215) BP: (105-130)/(66-78) 105/70 (08/22 1215) SpO2:  [93 %-100 %] 95 % (08/22 1215) Weight:  [63.5 kg] 63.5 kg (08/22 0544)  Intake/Output from previous day: No intake/output data recorded. Intake/Output this shift: Total I/O In: 2210.4 [P.O.:240; I.V.:1970.4] Out: 470 [Urine:200; Drains:70; Blood:200]  Physical Exam:  General: Alert and oriented. Abdomen: Soft, Nondistended. Incisions: Clean and dry. GU: Urine clear.  Lab Results: Recent Labs    11/16/20 1100  HGB 12.3*  HCT 38.9*    Assessment/Plan: POD#0   1) Continue to monitor, ambulate, IS   Shane Adkins. MD   LOS: 0 days   Shane Adkins 11/16/2020, 6:48 PM

## 2020-11-16 NOTE — Discharge Instructions (Signed)
Activity:  You are encouraged to ambulate frequently (about every hour during waking hours) to help prevent blood clots from forming in your legs or lungs.  However, you should not engage in any heavy lifting (> 10-15 lbs), strenuous activity, or straining. Diet: You should continue a clear liquid diet until passing gas from below.  Once this occurs, you may advance your diet to a soft diet that would be easy to digest (i.e soups, scrambled eggs, mashed potatoes, etc.) for 24 hours just as you would if getting over a bad stomach flu.  If tolerating this diet well for 24 hours, you may then begin eating regular food.  It will be normal to have some amount of bloating, nausea, and abdominal discomfort intermittently. Prescriptions:  You will be provided a prescription for pain medication to take as needed.  If your pain is not severe enough to require the prescription pain medication, you may take extra strength Tylenol instead.  You should also take an over the counter stool softener (Colace 100 mg twice daily) to avoid straining with bowel movements as the pain medication may constipate you. Finally, you will also be provided a prescription for an antibiotic to begin the day prior to your return visit in the office for catheter removal. Catheter care: You will be taught how to take care of the catheter by the nursing staff prior to discharge from the hospital.  You may use both a leg bag and the larger bedside bag but it is recommended to at least use the bigger bedside bag at nighttime as the leg bag is small and will fill up overnight and also does not drain as well when lying flat. You may periodically feel a strong urge to void with the catheter in place.  This is a bladder spasm and most often can occur when having a bowel movement or when you are moving around. It is typically self-limited and usually will stop after a few minutes.  You may use some Vaseline or Neosporin around the tip of the catheter to  reduce friction at the tip of the penis. Incisions: There is no special wound care.  You have a skin glue over most of the incisions.  You may start showering (not soaking or bathing in water) 48 hours after surgery and the incisions simply need to be patted dry after the shower.  No additional care is needed. What to call us about: You should call the office [phone]) if you develop fever > 101, persistent vomiting, or the catheter stops draining. Also, feel free to call with any other questions you may have and remember the handout that was provided to you as a reference preoperatively which answers many of the common questions that arise after surgery.

## 2020-11-16 NOTE — Op Note (Signed)
Preoperative diagnosis: Clinically localized adenocarcinoma of the prostate (clinical stage T1c N0 M0)  Postoperative diagnosis: Clinically localized adenocarcinoma of the prostate (clinical stage T1c N0 M0)  Procedure: Robotic assisted laparoscopic radical prostatectomy (bilateral nerve sparing)   Surgeon: Roxy Horseman, Brooke Bonito. M.D.  Assistant: Dr. Windell Norfolk  An assistant was required for this surgical procedure.  The duties of the assistant included but were not limited to suctioning, passing suture, camera manipulation, retraction. This procedure would not be able to be performed without an Environmental consultant.  Anesthesia: General  Complications: None  EBL: 200 mL  IVF:  1800 mL crystalloid  Specimens: Prostate and seminal vesicles  Disposition of specimens: Pathology  Drains: 20 Fr coude catheter # 19 Blake pelvic drain  Indication: Shane Adkins is a 70 y.o. year old patient with clinically localized prostate cancer.  After a thorough review of the management options for treatment of prostate cancer, he elected to proceed with surgical therapy and the above procedure(s).  We have discussed the potential benefits and risks of the procedure, side effects of the proposed treatment, the likelihood of the patient achieving the goals of the procedure, and any potential problems that might occur during the procedure or recuperation. Informed consent has been obtained.  Description of procedure:  The patient was taken to the operating room and a general anesthetic was administered. He was given preoperative antibiotics, placed in the dorsal lithotomy position, and prepped and draped in the usual sterile fashion. Next a preoperative timeout was performed. A urethral catheter was placed into the bladder and a site was selected near the umbilicus for placement of the camera port. This was placed using a standard open Hassan technique which allowed entry into the peritoneal cavity under direct  vision and without difficulty. An 8 mm robotic port was placed and a pneumoperitoneum established. The camera was then used to inspect the abdomen and there was no evidence of any intra-abdominal injuries or other abnormalities. The remaining abdominal ports were then placed. 8 mm robotic ports were placed in the right lower quadrant, left lower quadrant, and far left lateral abdominal wall. A 5 mm port was placed in the right upper quadrant and a 12 mm port was placed in the right lateral abdominal wall for laparoscopic assistance. All ports were placed under direct vision without difficulty. The surgical cart was then docked.   Utilizing the cautery scissors, the bladder was reflected posteriorly allowing entry into the space of Retzius and identification of the endopelvic fascia and prostate. He had undergone a prior laparoscopic bilateral inguinal hernia repair and there were significant adhesions between the hernia mesh and the bladder anteriorly.  This required tedious, careful dissection but was otherwise uncomplicated.  The mesh was noted to extend over the pelvic side walls bilaterally. The periprostatic fat was then removed from the prostate allowing full exposure of the endopelvic fascia. The endopelvic fascia was then incised from the apex back to the base of the prostate bilaterally and the underlying levator muscle fibers were swept laterally off the prostate thereby isolating the dorsal venous complex. The dorsal vein was then stapled and divided with a 45 mm Flex Echelon stapler. Attention then turned to the bladder neck which was divided anteriorly thereby allowing entry into the bladder and exposure of the urethral catheter. The catheter balloon was deflated and the catheter was brought into the operative field and used to retract the prostate anteriorly. The posterior bladder neck was then examined and was divided allowing further  dissection between the bladder and prostate posteriorly until the  vasa deferentia and seminal vessels were identified. The vasa deferentia were isolated, divided, and lifted anteriorly. The seminal vesicles were dissected down to their tips with care to control the seminal vascular arterial blood supply. These structures were then lifted anteriorly and the space between Denonvillier's fascia and the anterior rectum was developed with a combination of sharp and blunt dissection. This isolated the vascular pedicles of the prostate.  The lateral prostatic fascia was then sharply incised allowing release of the neurovascular bundles bilaterally. The vascular pedicles of the prostate were then ligated with Weck clips between the prostate and neurovascular bundles and divided with sharp cold scissor dissection resulting in neurovascular bundle preservation. The neurovascular bundles were then separated off the apex of the prostate and urethra bilaterally.  The urethra was then sharply transected allowing the prostate specimen to be disarticulated. The pelvis was copiously irrigated and hemostasis was ensured. There was no evidence for rectal injury.  Attention then turned to the pelvic sidewalls in preparation for lymphadenectomy.  Examination of the the pelvic sidewalls demonstrated that the hernia mesh extended over both external iliac vessels down to the internal iliac vessels and essentially covered the entire pelvic nodal packet.  I did consult with General Surgery and it was felt that the risk of potential vascular or neurologic injury would outweigh the therapeutic benefit of lymphadenectomy in this clinical situation.  I did discuss this decision with the patient's wife and daughters and they were in agreement with the decision to not perform a pelvic lymphadenectomy.   Attention then turned to the urethral anastomosis. A 2-0 Vicryl slip knot was placed between Denonvillier's fascia, the posterior bladder neck, and the posterior urethra to reapproximate these structures.  A double-armed 3-0 Monocryl suture was then used to perform a 360 running tension-free anastomosis between the bladder neck and urethra. A new urethral catheter was then placed into the bladder and irrigated. There were no blood clots within the bladder and the anastomosis appeared to be watertight. A #19 Blake drain was then brought through the left lateral 8 mm port site and positioned appropriately within the pelvis. It was secured to the skin with a nylon suture. The surgical cart was then undocked. The right lateral 12 mm port site was closed at the fascial level with a 0 Vicryl suture placed laparoscopically. All remaining ports were then removed under direct vision. The prostate specimen was removed intact within the Endopouch retrieval bag via the periumbilical camera port site. This fascial opening was closed with two running 0 Vicryl sutures. 0.25% Marcaine was then injected into all port sites and all incisions were reapproximated at the skin level with 4-0 Monocryl subcuticular sutures and Dermabond. The patient appeared to tolerate the procedure well and without complications. The patient was able to be extubated and transferred to the recovery unit in satisfactory condition.   Pryor Curia MD

## 2020-11-16 NOTE — Anesthesia Procedure Notes (Signed)
Procedure Name: Intubation Date/Time: 11/16/2020 7:32 AM Performed by: Montel Clock, CRNA Pre-anesthesia Checklist: Patient identified, Emergency Drugs available, Suction available, Patient being monitored and Timeout performed Patient Re-evaluated:Patient Re-evaluated prior to induction Oxygen Delivery Method: Circle system utilized Preoxygenation: Pre-oxygenation with 100% oxygen Induction Type: IV induction Ventilation: Mask ventilation without difficulty and Oral airway inserted - appropriate to patient size Laryngoscope Size: Mac and 3 Grade View: Grade I Tube type: Oral Tube size: 7.5 mm Number of attempts: 1 Airway Equipment and Method: Stylet Placement Confirmation: ETT inserted through vocal cords under direct vision, positive ETCO2 and breath sounds checked- equal and bilateral Secured at: 23 cm Tube secured with: Tape Dental Injury: Teeth and Oropharynx as per pre-operative assessment

## 2020-11-16 NOTE — Transfer of Care (Signed)
Immediate Anesthesia Transfer of Care Note  Patient: Shane Adkins  Procedure(s) Performed: XI ROBOTIC ASSISTED LAPAROSCOPIC RADICAL PROSTATECTOMY LEVEL 2  Patient Location: PACU  Anesthesia Type:General  Level of Consciousness: drowsy and patient cooperative  Airway & Oxygen Therapy: Patient Spontanous Breathing and Patient connected to face mask oxygen  Post-op Assessment: Report given to RN and Post -op Vital signs reviewed and stable  Post vital signs: Reviewed and stable  Last Vitals:  Vitals Value Taken Time  BP 124/66 11/16/20 1048  Temp    Pulse 56 11/16/20 1050  Resp 16 11/16/20 1050  SpO2 98 % 11/16/20 1050  Vitals shown include unvalidated device data.  Last Pain:  Vitals:   11/16/20 0558  TempSrc:   PainSc: 0-No pain      Patients Stated Pain Goal: 3 ([id number])  Complications: No notable events documented.

## 2020-11-17 ENCOUNTER — Encounter (HOSPITAL_COMMUNITY): Payer: Self-pay | Admitting: Urology

## 2020-11-17 DIAGNOSIS — C61 Malignant neoplasm of prostate: Secondary | ICD-10-CM | POA: Diagnosis not present

## 2020-11-17 LAB — HEMOGLOBIN AND HEMATOCRIT, BLOOD
HCT: 31.6 % — ABNORMAL LOW (ref 39.0–52.0)
HCT: 34.5 % — ABNORMAL LOW (ref 39.0–52.0)
Hemoglobin: 10.3 g/dL — ABNORMAL LOW (ref 13.0–17.0)
Hemoglobin: 11.1 g/dL — ABNORMAL LOW (ref 13.0–17.0)

## 2020-11-17 MED ORDER — BISACODYL 10 MG RE SUPP
10.0000 mg | Freq: Once | RECTAL | Status: AC
  Administered 2020-11-17: 10 mg via RECTAL
  Filled 2020-11-17: qty 1

## 2020-11-17 MED ORDER — TRAMADOL HCL 50 MG PO TABS: 50.0000 mg | ORAL_TABLET | Freq: Four times a day (QID) | ORAL | Status: DC | PRN

## 2020-11-17 NOTE — Plan of Care (Signed)
  Problem: Pain Management: Goal: General experience of comfort will improve Outcome: Progressing   Problem: Bowel/Gastric: Goal: Gastrointestinal status for postoperative course will improve Outcome: Progressing   Problem: Urinary Elimination: Goal: Ability to avoid or minimize complications of infection will improve Outcome: Progressing Goal: Ability to achieve and maintain urine output will improve Outcome: Progressing Goal: Home care management will improve Outcome: Progressing

## 2020-11-17 NOTE — Anesthesia Postprocedure Evaluation (Signed)
Anesthesia Post Note  Patient: Shane Adkins  Procedure(s) Performed: XI ROBOTIC ASSISTED LAPAROSCOPIC RADICAL PROSTATECTOMY LEVEL 2     Patient location during evaluation: PACU Anesthesia Type: General Level of consciousness: awake and alert Pain management: pain level controlled Vital Signs Assessment: post-procedure vital signs reviewed and stable Respiratory status: spontaneous breathing, nonlabored ventilation and respiratory function stable Cardiovascular status: blood pressure returned to baseline and stable Postop Assessment: no apparent nausea or vomiting Anesthetic complications: no   No notable events documented.  Last Vitals:  Vitals:   11/17/20 0026 11/17/20 0452  BP: (!) 95/56 (!) 98/59  Pulse: 68 76  Resp: 18 18  Temp: 36.8 C 37 C  SpO2: 96% 94%    Last Pain:  Vitals:   11/17/20 0452  TempSrc: Oral  PainSc:                  Merlinda Frederick

## 2020-11-17 NOTE — Progress Notes (Signed)
The patient has been discharged per MD order. The patients VSS. The patient, Spouse, and Daughters at bedside have received discharge instructions and supplies and endorse no additional questions at this time. All hospital equipment, with exception of the foley have been removed from the patient. The patient was transported downstairs with no complications and released to family.

## 2020-11-17 NOTE — Progress Notes (Signed)
Patient ID: Shane Adkins, male   DOB: June 03, 1950, 70 y.o.   MRN: [id number]  1 Day Post-Op Subjective: The patient is doing well.  No nausea or vomiting. Pain is adequately controlled.  Objective: Vital signs in last 24 hours: Temp:  [96.9 F (36.1 C)-98.6 F (37 C)] 98.6 F (37 C) (08/23 0452) Pulse Rate:  [48-76] 76 (08/23 0452) Resp:  [16-27] 18 (08/23 0452) BP: (95-124)/(56-78) 98/59 (08/23 0452) SpO2:  [93 %-100 %] 94 % (08/23 0452)  Intake/Output from previous day: 08/22 0701 - 08/23 0700 In: 4112.9 [P.O.:600; I.V.:3413.8; IV Piggyback:99] Out: 2795 [Urine:2450; Drains:145; Blood:200] Intake/Output this shift: No intake/output data recorded.  Physical Exam:  General: Alert and oriented. CV: RRR Lungs: Clear bilaterally. GI: Soft, Nondistended. Incisions: Clean, dry, and intact Urine: Clear Extremities: Nontender, no erythema, no edema.  Lab Results: Recent Labs    11/16/20 1100 11/17/20 0434  HGB 12.3* 10.3*  HCT 38.9* 31.6*      Assessment/Plan: POD# 1 s/p robotic prostatectomy.  1) SL IVF 2) Ambulate, Incentive spirometry 3) Transition to oral pain medication 4) Dulcolax suppository 5) D/C pelvic drain 6) Will recheck Hgb later this morning to ensure dilutional changes only 7) Plan for likely discharge later today   Shane Curia. MD   LOS: 0 days   Shane Adkins 11/17/2020, 7:25 AM

## 2020-11-17 NOTE — Discharge Summary (Signed)
Date of admission: 11/16/2020  Date of discharge: 11/17/2020  Admission diagnosis: Prostate Cancer  Discharge diagnosis: Prostate Cancer  History and Physical: For full details, please see admission history and physical. Briefly, Shane Adkins is a 70 y.o. gentleman with localized prostate cancer.  After discussing management/treatment options, he elected to proceed with surgical treatment.  Hospital Course: JMARI PELC was taken to the operating room on 11/16/2020 and underwent a robotic assisted laparoscopic radical prostatectomy. He tolerated this procedure well and without complications. Postoperatively, he was able to be transferred to a regular hospital room following recovery from anesthesia.  He was able to begin ambulating the night of surgery. He remained hemodynamically stable overnight.  He had excellent urine output with appropriately minimal output from his pelvic drain and his pelvic drain was removed on POD #1.  He was transitioned to oral pain medication, tolerated a clear liquid diet, and had met all discharge criteria and was able to be discharged home later on POD#1.  Laboratory values:  Recent Labs    11/16/20 1100 11/17/20 0434 11/17/20 1109  HGB 12.3* 10.3* 11.1*  HCT 38.9* 31.6* 34.5*    Disposition: Home  Discharge instruction: He was instructed to be ambulatory but to refrain from heavy lifting, strenuous activity, or driving. He was instructed on urethral catheter care.  Discharge medications:   Allergies as of 11/17/2020   No Known Allergies      Medication List     TAKE these medications    multivitamin with minerals Tabs tablet Take 1 tablet by mouth daily.   sulfamethoxazole-trimethoprim 800-160 MG tablet Commonly known as: BACTRIM DS Take 1 tablet by mouth 2 (two) times daily. Begin the day prior to catheter removal (postoperative visit)   traMADol 50 MG tablet Commonly known as: ULTRAM Take 1-2 tablets (50-100 mg total) by mouth every 6  (six) hours as needed (pain).   zolpidem 10 MG tablet Commonly known as: AMBIEN Take 10 mg by mouth at bedtime as needed for sleep.        Followup: He will followup in 1 week for catheter removal and to discuss his surgical pathology results.

## 2020-11-20 LAB — SURGICAL PATHOLOGY

## 2021-02-24 ENCOUNTER — Other Ambulatory Visit: Payer: Self-pay | Admitting: Family Medicine

## 2021-02-24 DIAGNOSIS — I2510 Atherosclerotic heart disease of native coronary artery without angina pectoris: Secondary | ICD-10-CM

## 2021-10-01 ENCOUNTER — Other Ambulatory Visit: Payer: 59

## 2021-10-11 ENCOUNTER — Ambulatory Visit
Admission: RE | Admit: 2021-10-11 | Discharge: 2021-10-11 | Disposition: A | Discharge status: Home / Self Care | Payer: 59 | Source: Ambulatory Visit | Attending: Family Medicine | Admitting: Family Medicine

## 2021-10-11 DIAGNOSIS — I2510 Atherosclerotic heart disease of native coronary artery without angina pectoris: Secondary | ICD-10-CM
